# Patient Record
Sex: Male | Born: 1985 | Race: Black or African American | Hispanic: No | Marital: Single | Smoking: Current every day smoker
Health system: Southern US, Community
[De-identification: ages and names within clinical notes are randomized; demographics above are authoritative.]

## PROBLEM LIST (undated history)

## (undated) HISTORY — PX: SMALL INTESTINE SURGERY: SHX150

---
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
## 2006-12-20 ENCOUNTER — Emergency Department (HOSPITAL_COMMUNITY): Admission: EM | Admit: 2006-12-20 | Discharge: 2006-12-20 | Payer: Self-pay | Admitting: Family Medicine

## 2011-04-30 LAB — POCT URINALYSIS DIP (DEVICE)
Bilirubin Urine: NEGATIVE
Ketones, ur: NEGATIVE
Operator id: 235561
Protein, ur: NEGATIVE
Specific Gravity, Urine: 1.025

## 2014-05-04 ENCOUNTER — Emergency Department (HOSPITAL_COMMUNITY): Payer: Self-pay

## 2014-05-04 ENCOUNTER — Observation Stay (HOSPITAL_COMMUNITY)
Admission: EM | Admit: 2014-05-04 | Discharge: 2014-05-05 | Disposition: A | Discharge status: Home / Self Care | Payer: Self-pay | Attending: Internal Medicine | Admitting: Internal Medicine

## 2014-05-04 ENCOUNTER — Encounter (HOSPITAL_COMMUNITY): Payer: Self-pay | Admitting: Emergency Medicine

## 2014-05-04 DIAGNOSIS — R112 Nausea with vomiting, unspecified: Secondary | ICD-10-CM

## 2014-05-04 DIAGNOSIS — R1110 Vomiting, unspecified: Secondary | ICD-10-CM | POA: Insufficient documentation

## 2014-05-04 DIAGNOSIS — K2970 Gastritis, unspecified, without bleeding: Secondary | ICD-10-CM

## 2014-05-04 DIAGNOSIS — R1013 Epigastric pain: Principal | ICD-10-CM | POA: Insufficient documentation

## 2014-05-04 DIAGNOSIS — Z23 Encounter for immunization: Secondary | ICD-10-CM | POA: Insufficient documentation

## 2014-05-04 DIAGNOSIS — R109 Unspecified abdominal pain: Secondary | ICD-10-CM

## 2014-05-04 LAB — CBC WITH DIFFERENTIAL/PLATELET
BASOS ABS: 0 10*3/uL (ref 0.0–0.1)
Basophils Relative: 0 % (ref 0–1)
EOS ABS: 0.1 10*3/uL (ref 0.0–0.7)
Eosinophils Relative: 1 % (ref 0–5)
HCT: 41.6 % (ref 39.0–52.0)
Hemoglobin: 14.5 g/dL (ref 13.0–17.0)
LYMPHS PCT: 10 % — AB (ref 12–46)
Lymphs Abs: 1.4 10*3/uL (ref 0.7–4.0)
MCH: 32.5 pg (ref 26.0–34.0)
MCHC: 34.9 g/dL (ref 30.0–36.0)
MCV: 93.3 fL (ref 78.0–100.0)
MONO ABS: 1.3 10*3/uL — AB (ref 0.1–1.0)
Monocytes Relative: 9 % (ref 3–12)
NEUTROS PCT: 80 % — AB (ref 43–77)
Neutro Abs: 11.6 10*3/uL — ABNORMAL HIGH (ref 1.7–7.7)
PLATELETS: 289 10*3/uL (ref 150–400)
RBC: 4.46 MIL/uL (ref 4.22–5.81)
RDW: 12.3 % (ref 11.5–15.5)
WBC: 14.4 10*3/uL — ABNORMAL HIGH (ref 4.0–10.5)

## 2014-05-04 LAB — COMPREHENSIVE METABOLIC PANEL
ALBUMIN: 4.2 g/dL (ref 3.5–5.2)
ALT: 19 U/L (ref 0–53)
AST: 17 U/L (ref 0–37)
Alkaline Phosphatase: 145 U/L — ABNORMAL HIGH (ref 39–117)
Anion gap: 11 (ref 5–15)
BUN: 8 mg/dL (ref 6–23)
CALCIUM: 9.5 mg/dL (ref 8.4–10.5)
CO2: 30 mEq/L (ref 19–32)
Chloride: 100 mEq/L (ref 96–112)
Creatinine, Ser: 0.82 mg/dL (ref 0.50–1.35)
GFR calc non Af Amer: >90 mL/min (ref >90)
GLUCOSE: 151 mg/dL — AB (ref 70–99)
POTASSIUM: 4.3 meq/L (ref 3.7–5.3)
SODIUM: 141 meq/L (ref 137–147)
TOTAL PROTEIN: 7.6 g/dL (ref 6.0–8.3)
Total Bilirubin: 0.5 mg/dL (ref 0.3–1.2)

## 2014-05-04 LAB — URINALYSIS, ROUTINE W REFLEX MICROSCOPIC
Bilirubin Urine: NEGATIVE
GLUCOSE, UA: NEGATIVE mg/dL
Hgb urine dipstick: NEGATIVE
Ketones, ur: NEGATIVE mg/dL
LEUKOCYTES UA: NEGATIVE
Nitrite: NEGATIVE
PROTEIN: NEGATIVE mg/dL
SPECIFIC GRAVITY, URINE: 1.01 (ref 1.005–1.030)
Urobilinogen, UA: 0.2 mg/dL (ref 0.0–1.0)
pH: 8 (ref 5.0–8.0)

## 2014-05-04 LAB — LIPASE, BLOOD: Lipase: 14 U/L (ref 11–59)

## 2014-05-04 LAB — I-STAT CG4 LACTIC ACID, ED: LACTIC ACID, VENOUS: 2.04 mmol/L (ref 0.5–2.2)

## 2014-05-04 MED ORDER — GI COCKTAIL ~~LOC~~
30.0000 mL | Freq: Once | ORAL | Status: AC
  Administered 2014-05-04: 30 mL via ORAL
  Filled 2014-05-04: qty 30

## 2014-05-04 MED ORDER — ONDANSETRON HCL 4 MG/2ML IJ SOLN
4.0000 mg | Freq: Four times a day (QID) | INTRAMUSCULAR | Status: DC | PRN
  Administered 2014-05-04: 4 mg via INTRAVENOUS
  Filled 2014-05-04: qty 2

## 2014-05-04 MED ORDER — ALUM & MAG HYDROXIDE-SIMETH 200-200-20 MG/5ML PO SUSP: 15.0000 mL | Freq: Once | ORAL | Status: DC

## 2014-05-04 MED ORDER — DIPHENHYDRAMINE HCL 25 MG PO CAPS
25.0000 mg | ORAL_CAPSULE | Freq: Every evening | ORAL | Status: DC | PRN
  Administered 2014-05-04: 25 mg via ORAL
  Filled 2014-05-04: qty 1

## 2014-05-04 MED ORDER — SODIUM CHLORIDE 0.9 % IJ SOLN: 3.0000 mL | Freq: Two times a day (BID) | INTRAMUSCULAR | Status: DC

## 2014-05-04 MED ORDER — PANTOPRAZOLE SODIUM 40 MG IV SOLR
40.0000 mg | Freq: Every day | INTRAVENOUS | Status: DC
  Administered 2014-05-04 – 2014-05-05 (×2): 40 mg via INTRAVENOUS
  Filled 2014-05-04 (×2): qty 40

## 2014-05-04 MED ORDER — RANITIDINE HCL 150 MG PO CAPS: 150.0000 mg | ORAL_CAPSULE | Freq: Every day | ORAL | Status: DC

## 2014-05-04 MED ORDER — SODIUM CHLORIDE 0.9 % IV SOLN
1000.0000 mL | Freq: Once | INTRAVENOUS | Status: AC
  Administered 2014-05-04 (×2): 1000 mL via INTRAVENOUS

## 2014-05-04 MED ORDER — LIDOCAINE VISCOUS 2 % MT SOLN: 15.0000 mL | Freq: Once | OROMUCOSAL | Status: DC

## 2014-05-04 MED ORDER — DOXYLAMINE SUCCINATE (SLEEP) 25 MG PO TABS
25.0000 mg | ORAL_TABLET | Freq: Every evening | ORAL | Status: DC | PRN
  Filled 2014-05-04: qty 1

## 2014-05-04 MED ORDER — HYDROMORPHONE HCL 1 MG/ML IJ SOLN
0.5000 mg | Freq: Four times a day (QID) | INTRAMUSCULAR | Status: DC | PRN
  Filled 2014-05-04: qty 1

## 2014-05-04 MED ORDER — ONDANSETRON HCL 4 MG/2ML IJ SOLN
4.0000 mg | Freq: Once | INTRAMUSCULAR | Status: AC
  Administered 2014-05-04: 4 mg via INTRAVENOUS
  Filled 2014-05-04: qty 2

## 2014-05-04 MED ORDER — HYDROMORPHONE HCL 1 MG/ML IJ SOLN
0.5000 mg | Freq: Four times a day (QID) | INTRAMUSCULAR | Status: DC | PRN
  Administered 2014-05-04 (×2): 0.5 mg via INTRAVENOUS
  Filled 2014-05-04: qty 1

## 2014-05-04 MED ORDER — FENTANYL CITRATE 0.05 MG/ML IJ SOLN
INTRAMUSCULAR | Status: AC
  Filled 2014-05-04: qty 2

## 2014-05-04 MED ORDER — HYDROMORPHONE HCL 1 MG/ML IJ SOLN
0.5000 mg | Freq: Once | INTRAMUSCULAR | Status: AC
  Administered 2014-05-04: 0.5 mg via INTRAVENOUS
  Filled 2014-05-04: qty 1

## 2014-05-04 MED ORDER — SODIUM CHLORIDE 0.9 % IV SOLN
1000.0000 mL | INTRAVENOUS | Status: DC
  Administered 2014-05-04 – 2014-05-05 (×3): 1000 mL via INTRAVENOUS

## 2014-05-04 MED ORDER — HEPARIN SODIUM (PORCINE) 5000 UNIT/ML IJ SOLN
5000.0000 [IU] | Freq: Three times a day (TID) | INTRAMUSCULAR | Status: DC
  Administered 2014-05-04 – 2014-05-05 (×2): 5000 [IU] via SUBCUTANEOUS
  Filled 2014-05-04 (×4): qty 1

## 2014-05-04 MED ORDER — IOHEXOL 300 MG/ML  SOLN: 100.0000 mL | Freq: Once | INTRAMUSCULAR | Status: DC | PRN

## 2014-05-04 MED ORDER — INFLUENZA VAC SPLIT QUAD 0.5 ML IM SUSY
0.5000 mL | PREFILLED_SYRINGE | INTRAMUSCULAR | Status: AC
  Administered 2014-05-05: 0.5 mL via INTRAMUSCULAR
  Filled 2014-05-04: qty 0.5

## 2014-05-04 MED ORDER — FENTANYL CITRATE 0.05 MG/ML IJ SOLN
50.0000 ug | Freq: Once | INTRAMUSCULAR | Status: AC
  Administered 2014-05-04: 50 ug via INTRAVENOUS

## 2014-05-04 MED ORDER — HYDROMORPHONE HCL 1 MG/ML IJ SOLN
0.5000 mg | Freq: Three times a day (TID) | INTRAMUSCULAR | Status: DC | PRN
  Administered 2014-05-04: 0.5 mg via INTRAVENOUS
  Filled 2014-05-04: qty 1

## 2014-05-04 MED ORDER — IOHEXOL 300 MG/ML  SOLN: 25.0000 mL | INTRAMUSCULAR | Status: DC | PRN

## 2014-05-04 NOTE — H&P (Addendum)
Pt seen and examined with Dr. Valentino Saxonothman. 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 Pt states that last night he ate chinese food and had a shot of alcohol and smoked some marijuana. Early this AM he developed abd pain - epigastric, radiated to RLQ initially but now mainly in epigastric region, crampy in nature, 10/10 at its worst but currently 4/10. He had associated n/v- 5 episodes initially containing food but now just clear fluid. No hematemesis, non bilious Pt denies flatus or having a BM since this AM. He states burping actually improves pain slightly. No fevers/chills, no CP, no sob, no palpitations, no lightheadedness/syncope. Pt did complain of diaphoresis assoc with n/v. Pt also complained of cloudy urine but no dysuria. Remaining ROS negative  Exam: Gen: AAO*3, NAD CV: RRR, normal heart sounds Pulm: CTA b/l Abd: soft, mild LLQ,LUQ tenderness, no rigidity/guarding Ext: no edema  Assessment and Plan: 28 y/o male p/w n/v, abd pain likely acute gastritis  Abd pain: - likely gastritis/gastroenteritis - abd x ray with dilated bowel loops but CT with no evidence of obstruction - CT with findings of gastritis - Pt with mild leukocytosis likely secondary to gastritis and stress demargination. Repeat CBC in AM - c/ IV PPI, ondansetron prn n/v - No abx for now. LA wnl - clear liquid diet for now - pain control prn, c/w IVF - check u/a - Surgery called by ED given concern for possible early obstruction but unlikely. Will await input  Case d/w patient, residents in detail

## 2014-05-04 NOTE — H&P (Signed)
Date: 05/04/2014               Patient Name:  Chad CootsGeorge W Wood MRN: [id number]  DOB: 02-14-1986 Age / Sex: 28 y.o., male   PCP: No primary provider on file.         Medical Service: Internal Medicine Teaching Service         Attending Physician: Dr. Inez CatalinaEmily B Mullen, MD    First Contact: Dr. Farley LyAdam Quintasha Gren Pager: [phone]  Second Contact: Dr. Evelena PeatAlex Wilson Pager: [phone]       After Hours (After 5p/  First Contact Pager: [phone]  weekends / holidays): Second Contact Pager: [phone]   Chief Complaint: abdominal pain  History of Present Illness: Mr Chad SheehanJenkins is a 28 year old man s/p small bowel resection 2/2 short bowel syndrome as an infant who presents with abdominal pain and vomiting. He was in his usual state of health until last night. He had fresh shrimp [place] food, a blunt of marijuana, and a shot of tequila in the evening. He then developed epigastric abdominal pain a few hours later. He says the pain is a cramping pain that waxes and wanes. He rates it as greater than 10/10 when he came to the ED but it is now about a 4-6. He briefly had some shortness of breath with the pain in his abdomen last night. He has never had this pain before. He has not had a bowel movement since the onset of pain. He said it radiated to his right lower quadrant after he had a cloudy urination but is now back to his epigastric region. He also has had 5 episodes of non-bloody non-bilious emesis. He has tolerated drinking ginger ale without aggravation of the pain. He has no appetite and also has been diaphoretic. He says he has not passed gas but feels like he has to and keeps burping. He denies any recent fevers, chills, diarrhea, constipation, melena, hematochezia, dysuria, polyuria, pain elsewhere, recent travel.  In the ED, he received NS IVF, zofran 4 mg IV x 2, dilaudid 0.5 mg IV, gi cocktail x 1, and fentanyl 50 mcg IV once.  Meds: Current Facility-Administered Medications  Medication Dose Route  Frequency Provider Last Rate Last Dose  . 0.9 %  sodium chloride infusion  1,000 mL Intravenous Continuous Fredirick LatheAndrew Peter Wong, MD 125 mL/hr at 05/04/14 0936 1,000 mL at 05/04/14 0936  . HYDROmorphone (DILAUDID) injection 0.5 mg  0.5 mg Intravenous Q8H PRN Lorenda HatchetAdam L Cordel Drewes, MD      . iohexol (OMNIPAQUE) 300 MG/ML solution 100 mL  100 mL Intravenous Once PRN Medication Radiologist, MD      . ondansetron (ZOFRAN) injection 4 mg  4 mg Intravenous Q6H PRN Lorenda HatchetAdam L Kentrell Guettler, MD      . pantoprazole (PROTONIX) injection 40 mg  40 mg Intravenous Daily Yolanda MangesAlex M Wilson, DO   40 mg at 05/04/14 1314   Current Outpatient Prescriptions  Medication Sig Dispense Refill  . Multiple Vitamins-Minerals (MULTIVITAMIN WITH MINERALS) tablet Take 1 tablet by mouth daily.      . ranitidine (ZANTAC) 150 MG capsule Take 1 capsule (150 mg total) by mouth daily.  30 capsule  0    Allergies: Allergies as of 05/04/2014  . (No Known Allergies)   History reviewed. No pertinent past medical history. Past Surgical History  Procedure Laterality Date  . Small intestine surgery     No family history on file. History   Social History  . Marital Status: Single  Spouse Name: N/A    Number of Children: N/A  . Years of Education: N/A   Occupational History  . Not on file.   Social History Main Topics  . Smoking status: Never Smoker   . Smokeless tobacco: Not on file  . Alcohol Use: Yes     Comment: occas  . Drug Use: Yes    Special: Marijuana  . Sexual Activity: Not on file   Other Topics Concern  . Not on file   Social History Narrative  . No narrative on file    Review of Systems: A comprehensive review of systems was negative except for: as noted above in HPI  Physical Exam: Blood pressure 123/69, pulse 87, temperature 97.6 F (36.4 C), temperature source Oral, resp. rate 20, SpO2 100.00%.  Gen: A&O x 4, diaphoretic, well developed, well nourished HEENT: Atraumatic, PERRL, EOMI, sclerae anicteric, moist  mucous membranes Neck: No LAD Heart: Regular rate and rhythm, normal S1 S2, no murmurs, rubs, or gallops Lungs: Clear to auscultation bilaterally, respirations unlabored Abd: Well healed vertical midline scar, soft, mild tenderness to palpation in LUQ and LLQ, non-distended, + bowel sounds, no hepatosplenomegaly, no Murphy's sign, no CVA tenderness Ext: No edema or cyanosis, 2+ PT pulses b/l   Lab results: Basic Metabolic Panel:  Recent Labs  40/98/1110/23/15 0849  NA 141  K 4.3  CL 100  CO2 30  GLUCOSE 151*  BUN 8  CREATININE 0.82  CALCIUM 9.5   Liver Function Tests:  Recent Labs  05/04/14 0849  AST 17  ALT 19  ALKPHOS 145*  BILITOT 0.5  PROT 7.6  ALBUMIN 4.2    Recent Labs  05/04/14 0849  LIPASE 14   No results found for this basename: AMMONIA,  in the last 72 hours CBC:  Recent Labs  05/04/14 0849  WBC 14.4*  NEUTROABS 11.6*  HGB 14.5  HCT 41.6  MCV 93.3  PLT 289   Misc. Labs: i-stat lactic acid 2.04  Imaging results:  Ct Abdomen Pelvis W Contrast  05/04/2014   CLINICAL DATA:  Abdomen pain. Patient had 90% of his small bowel removed when he was born. Assess for pancreatitis.  EXAM: CT ABDOMEN AND PELVIS WITH CONTRAST  TECHNIQUE: Multidetector CT imaging of the abdomen and pelvis was performed using the standard protocol following bolus administration of intravenous contrast.  CONTRAST:  100 mL Omnipaque 300  COMPARISON:  None.  FINDINGS: The liver, spleen, pancreas, adrenal glands and kidneys are normal. The gallbladder is decompressed limiting evaluation. There is no hydronephrosis bilaterally. There is a gastric wall thickening in the gastric antrum and body. There is no free air. Bowel content is noted throughout colon. The visualized small amount of small bowel loops are prominent in caliber. There is a 4.4 mm calcification medial to the mid ascending colon, nonspecific. The aorta is normal. There is no abdominal lymphadenopathy.  Fluid-filled bladder is  normal. There is no pelvic lymphadenopathy. The visualized lung bases are clear. No acute abnormalities identified within the visualized bones.  IMPRESSION: No CT evidence of pancreatitis. Bowel wall thickening of the gastric antrum and body which can be seen in gastritis.   Electronically Signed   By: Sherian ReinWei-Chen  Lin M.D.   On: 05/04/2014 10:54   Dg Abd Acute W/chest  05/04/2014   CLINICAL DATA:  Pain.  EXAM: ACUTE ABDOMEN SERIES (ABDOMEN 2 VIEW & CHEST 1 VIEW)  COMPARISON:  CT 05/04/2014.  FINDINGS: No acute cardiopulmonary disease.  Soft tissue structures are unremarkable. Dilated  loops of what appear to be small bowel noted. Developing small bowel obstruction cannot be excluded. These changes may be related this adynamic ileus. Colon is unremarkable. No free air. Calcification noted over the right flank as noted on prior CT is unchanged in position and nonspecific. No acute bony abnormality.  IMPRESSION: 1. Prominently dilated loops of bowel noted in the upper abdomen, these most likely small bowel loops. Developing small bowel obstruction cannot be excluded. 2. No acute abnormalities otherwise noted.  Pressure   Electronically Signed   By: Maisie Fus  Register   On: 05/04/2014 11:29    Other results: EKG: none.  Assessment & Plan by Problem: Active Problems:   Abdominal pain   Gastritis  #Gastritis v Gastroenteritis: Mr Slusher has 1 d of epigastric abdominal pain with NBNB emesis. 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 He does say that this was in the setting of shrimp [place] food, THC, and tequila. He is afebrile but WBC 14.4. Lipase negative makes pancreatitis unlikely. Alkaline phosphatase 145 with CMP otherwise normal. Cholecystitis or other bile duct abnormality unlikely as he had CT abdomen in the ED only notable for bowel wall thickening of the gastric antrum and body which can be seen in  gastritis. Substance use would aggravate gastritis and can explain. He may also have gastroenteritis as he has elevated WBC and is diaphoretic. His abdominal film did have prominently dilated loops of bowel in upper abdomen and he has prior surgical history which can lead to adhesions, but CT results make SBO unlikely although he has not passed gas today. Reports cloudy urine but no dysuria or polyuria and no suprapubic or CVA tenderness so UTI unlikely. He was seen by surgery in the ED who do not think he has an SBO and suggest medical management for likely gastritis. -appreciate surgery -check UA w reflex microscopy -repeat CMP, CBC in am -pantoprazole 40 mg IV daily -ondansetron 4 mg iv q6hprn -dilaudid 0.5 mg iv q8hprn -NS IVF 125 mL/hr -advance diet as tolerated  #Diet : clears  #VTE PPx: heparin 5000 u Ryan TID, SCDs  Dispo: Disposition is deferred at this time, awaiting improvement of current medical problems. Anticipated discharge in approximately 1 day(s).   The patient does not know have a current PCP (No primary provider on file.) and does need an [facility] hospital follow-up appointment after discharge.  The patient does not have transportation limitations that hinder transportation to clinic appointments.  Signed: Lorenda Hatchet, MD 05/04/2014, 1:44 PM

## 2014-05-04 NOTE — ED Notes (Signed)
Pt given urinal for sample. Stated that he would try and give a sample. Instructed to call when sample is obtained

## 2014-05-04 NOTE — Discharge Planning (Signed)
[facility] Community Health & Eligibility Specialist  Spoke with patient regarding primary care resources and the [facility]. Orange card application and instructions provided. Resource guide and my contact information also given for any future questions or concerns. No other Community Health & Eligibility Specialist needs identified at this time.

## 2014-05-04 NOTE — ED Notes (Signed)
Pt being transported to CT

## 2014-05-04 NOTE — ED Provider Notes (Signed)
I saw and evaluated the patient, reviewed the resident's note and I agree with the findings and plan.   .Face to face Exam:  General:  Awake HEENT:  Atraumatic Resp:  Normal effort Abd:  Nondistended Neuro:No focal weakness Lymph: No adenopathy  Nelia Shiobert L Nikeshia Keetch, MD 05/04/14 1123

## 2014-05-04 NOTE — ED Notes (Addendum)
Dr. Andrey CampanileWilson paged about pt's pain medicine. Per admitting doctor, she will look into changing pain medication order due to pt's request for more pain medicine.

## 2014-05-04 NOTE — ED Notes (Signed)
Spoke with Dr. Andrey CampanileWilson on phone regarding pt's episode of emesis.

## 2014-05-04 NOTE — ED Notes (Signed)
General surgery at bedside for consult

## 2014-05-04 NOTE — ED Notes (Signed)
Pt reports some relief from abdominal pain, pt getting ready to be discharged and ginger ale given.  Pt tolerating PO intake well, is taking sips, reports "it makes me burp" which relieves discomfort.

## 2014-05-04 NOTE — H&P (Signed)
  I have seen and examined the patient myself, and I have reviewed the note by Duwayne Heckanielle Day, MS 3 and was present during the interview and physical exam.  Please see my separate H&P for additional findings, assessment, and plan.   Signed: Lorenda HatchetAdam L Kayleeann Huxford, MD 05/04/2014, 5:28 PM

## 2014-05-04 NOTE — ED Provider Notes (Addendum)
CSN: [id number]     Arrival date & time 05/04/14  0825 History   First MD Initiated Contact with Patient 05/04/14 0913     Chief Complaint  Patient presents with  . Abdominal Pain     (Consider location/radiation/quality/duration/timing/severity/associated sxs/prior Treatment) Patient is a 28 y.o. male presenting with abdominal pain. The history is provided by the patient. No language interpreter was used.  Abdominal Pain Pain location:  Epigastric Pain quality: sharp   Pain radiates to:  Does not radiate Pain severity:  Severe Onset quality:  Sudden Duration:  7 hours Timing:  Constant Progression:  Worsening Chronicity:  New Context: suspicious food intake (possible)   Context: not recent illness, not recent travel and not sick contacts   Relieved by:  Lying down Worsened by:  Movement Ineffective treatments:  None tried Associated symptoms: flatus   Associated symptoms: no chest pain, no cough, no diarrhea, no dysuria, no fever, no hematemesis, no hematuria, no melena, no nausea, no shortness of breath, no sore throat and no vomiting   Risk factors: no recent hospitalization     History reviewed. No pertinent past medical history. Past Surgical History  Procedure Laterality Date  . Small intestine surgery     No family history on file. History  Substance Use Topics  . Smoking status: Never Smoker   . Smokeless tobacco: Not on file  . Alcohol Use: Yes     Comment: occas    Review of Systems  Constitutional: Negative for fever.  HENT: Negative for congestion, rhinorrhea and sore throat.   Respiratory: Negative for cough and shortness of breath.   Cardiovascular: Negative for chest pain.  Gastrointestinal: Positive for flatus. Negative for nausea, vomiting, abdominal pain, diarrhea, melena and hematemesis.  Genitourinary: Negative for dysuria and hematuria.  Skin: Negative for rash.  Neurological: Negative for syncope, light-headedness and headaches.  All other  systems reviewed and are negative.     Allergies  Review of patient's allergies indicates no known allergies.  Home Medications   Prior to Admission medications   Medication Sig Start Date End Date Taking? Authorizing Provider  Multiple Vitamins-Minerals (MULTIVITAMIN WITH MINERALS) tablet Take 1 tablet by mouth daily.   Yes Historical Provider, MD   BP 142/84  Pulse 77  Temp(Src) 97.6 F (36.4 C) (Oral)  Resp 18  SpO2 100% Physical Exam  Nursing note and vitals reviewed. Constitutional: He is oriented to person, place, and time. He appears well-developed and well-nourished.  HENT:  Head: Normocephalic and atraumatic.  Right Ear: External ear normal.  Left Ear: External ear normal.  Eyes: EOM are normal.  Neck: Normal range of motion. Neck supple.  Cardiovascular: Normal rate, regular rhythm and intact distal pulses.  Exam reveals no gallop and no friction rub.   No murmur heard. Pulmonary/Chest: Effort normal and breath sounds normal. No respiratory distress. He has no wheezes. He has no rales. He exhibits no tenderness.  Abdominal: Soft. Bowel sounds are normal. He exhibits no distension. There is tenderness (epigastric). There is no rebound.  No rigidity  Musculoskeletal: Normal range of motion. He exhibits no edema and no tenderness.  Lymphadenopathy:    He has no cervical adenopathy.  Neurological: He is alert and oriented to person, place, and time.  Skin: Skin is warm. No rash noted.  Psychiatric: He has a normal mood and affect. His behavior is normal.    ED Course  Procedures (including critical care time) Labs Review Labs Reviewed  CBC WITH DIFFERENTIAL - Abnormal;  Notable for the following:    WBC 14.4 (*)    Neutrophils Relative % 80 (*)    Lymphocytes Relative 10 (*)    Neutro Abs 11.6 (*)    Monocytes Absolute 1.3 (*)    All other components within normal limits  COMPREHENSIVE METABOLIC PANEL - Abnormal; Notable for the following:    Glucose, Bld  151 (*)    Alkaline Phosphatase 145 (*)    All other components within normal limits  LIPASE, BLOOD  URINALYSIS, ROUTINE W REFLEX MICROSCOPIC  I-STAT CG4 LACTIC ACID, ED    Imaging Review Ct Abdomen Pelvis W Contrast  05/04/2014   CLINICAL DATA:  Abdomen pain. Patient had 90% of his small bowel removed when he was born. Assess for pancreatitis.  EXAM: CT ABDOMEN AND PELVIS WITH CONTRAST  TECHNIQUE: Multidetector CT imaging of the abdomen and pelvis was performed using the standard protocol following bolus administration of intravenous contrast.  CONTRAST:  100 mL Omnipaque 300  COMPARISON:  None.  FINDINGS: The liver, spleen, pancreas, adrenal glands and kidneys are normal. The gallbladder is decompressed limiting evaluation. There is no hydronephrosis bilaterally. There is a gastric wall thickening in the gastric antrum and body. There is no free air. Bowel content is noted throughout colon. The visualized small amount of small bowel loops are prominent in caliber. There is a 4.4 mm calcification medial to the mid ascending colon, nonspecific. The aorta is normal. There is no abdominal lymphadenopathy.  Fluid-filled bladder is normal. There is no pelvic lymphadenopathy. The visualized lung bases are clear. No acute abnormalities identified within the visualized bones.  IMPRESSION: No CT evidence of pancreatitis. Bowel wall thickening of the gastric antrum and body which can be seen in gastritis.   Electronically Signed   By: Abelardo Diesel M.D.   On: 05/04/2014 10:54   CLINICAL DATA: Pain.  EXAM: ACUTE ABDOMEN SERIES (ABDOMEN 2 VIEW & CHEST 1 VIEW)  COMPARISON: CT 05/04/2014.  FINDINGS: No acute cardiopulmonary disease.  Soft tissue structures are unremarkable. Dilated loops of what appear to be small bowel noted. Developing small bowel obstruction cannot be excluded. These changes may be related this adynamic ileus. Colon is unremarkable. No free air. Calcification noted over the right  flank as noted on prior CT is unchanged in position and nonspecific. No acute bony abnormality.  IMPRESSION: 1. Prominently dilated loops of bowel noted in the upper abdomen, these most likely small bowel loops. Developing small bowel obstruction cannot be excluded. 2. No acute abnormalities otherwise noted. Pressure    EKG Interpretation None      MDM   Final diagnoses:  None    9:13 AM Pt is a 28 y.o. male with no pertinent PMHX who presents to the ED with sudden onset epigastric pain this morning 1-2AM. Sharp intermittent pain worse with movement, better with laying still. No fevers. Vomiting 5x today non bilious non bloody emesis. No diarrhea. Previous surgery on small bowel as child. Last BM yesterday morning. Passing gas. No other significant abdominal surgeries. Denies chest pain. Possible suspect food intake. Passing gas. Possible obstruction given previous surgery. No family or personal history of inflammatory bowel disease. No bloody stools  On exam: diaphoretic. Epigastric tenderness. No rigidity. No RLQ tenderness. No murphy's no peritoneal signs. Concern for possible pancreatitis versus small bowel obstruction versus possible perforated ulcer: no ETOH abuse: no bloody emesis.  Plan for CBC, CMP, lactic acid, fluids, pain medications, acute abdominal series to rule out free air and infused abdomen  pelvis CT  Review of labs: Lactic acid: 2.04 CBc: leukocytosis, H&H 14.5/41.6 Lipase: 14 CMP: no electrolyte abnormalities, elevated alk phos  11:10AM patient feeling subjectively better. CT abodmen pelvis w contrast: no evidence of pancreatitis, evidence of gastritis with no evidence of free air or perforated ulcer. Concern for early bowel obstruction on plain film imaging. Consult to surgery  Per surgery no acute surgical intervention. Plan to consult internal medicine teaching service for admission for gastritis bowel rest and IVF  Labs, EKG and imaging reviewed by myself  and considered in medical decision making if ordered.  Imaging interpreted by radiology. Pt was discussed with my attending, Dr. Lulu Riding, MD 05/04/14 [place], MD 05/04/14 [phone]

## 2014-05-04 NOTE — ED Notes (Signed)
Lactic acid results given to Dr. Beaton 

## 2014-05-04 NOTE — ED Notes (Signed)
Pt reports upper abd pain since last night and emesis x 4 between last night and now. Pt states he had just finished eating when the pain started. Was unable to sleep last night.

## 2014-05-04 NOTE — H&P (Signed)
Date: 05/04/2014               Patient Name:  Chad Wood MRN: [id number]  DOB: [birth year] Age / Sex: 28 y.o., male   PCP: No primary provider on file.              Medical Service: Internal Medicine Teaching Service              Attending Physician: Dr. Inez Catalina, MD    First Contact:  MS  Pager: [phone]  Second Contact: Dr.  Dineen Kid: [phone]  Third Contact Dr.  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 He was in his usual state of health until last night. He describes his abdominal pain as starting out as a 10/10 last evening and resolving to a 4/10 by this afternoon. He describes a cramping pain as coming and going and occasionally radiating out to his right flank, followed by a generalized tightness in his left upper quadrant. He has not been able to pass gas or stool today, but yesterday he had a normal bowel movement and has been excessively burping. He has vomited 5 times since last night when the abdominal pain began and describes the vomiting as non-bloody and non-bilious. He endorses eating chinese food with shrimp last night followed by a tequila shot, and marijuana. He has not eaten anything yet today and has not had an appetite, but he was able to drink some ginger ale. He denies headaches, dizziness, SOB, but describes sweating and chills. He denied dysuria/frequency/urethral discharge but described his urine as cloudy yesterday. He has not recently traveled in or out of the [place] recently.   Chad Wood got an abdominal CT and DG in the emergency department. The abdominal CT showed evidence of bowel wall thickening of the gastric antrum and body  which can be seen in gastritis and no evidence of pancreatitis and the abdominal DG noted dilated loops of small bowel and was not able to rule out a developing SBO. His labs while in the ED were notable for an elevated white blood cell count of 14.4, an elevated blood glucose of 151, and an elevated alkaline phosphate of 145. In the ED he was also treated with NS IVF, zofran 4 mg IV x 2, dilaudid 0.5 mg IV, gi cocktail x 1, and fentanyl 50 mcg IV once. Surgery also saw Chad Wood earlier today and they attribute his abdominal illness to gastritis or gastroenteritis rather than a SBO, so surgical intervention is not currently required.   Meds: Current Facility-Administered Medications  Medication Dose Route Frequency Provider Last Rate Last Dose  . 0.9 %  sodium chloride infusion  1,000 mL Intravenous Continuous Fredirick Lathe, MD 125 mL/hr at 05/04/14 0936 1,000 mL at 05/04/14 0936  . iohexol (OMNIPAQUE) 300 MG/ML solution 100 mL  100 mL Intravenous Once PRN Medication Radiologist, MD      . pantoprazole (PROTONIX) injection 40 mg  40 mg Intravenous Daily Yolanda Manges, DO   40 mg at 05/04/14 1314   Current Outpatient Prescriptions  Medication Sig Dispense Refill  . Multiple  Vitamins-Minerals (MULTIVITAMIN WITH MINERALS) tablet Take 1 tablet by mouth daily.      . ranitidine (ZANTAC) 150 MG capsule Take 1 capsule (150 mg total) by mouth daily.  30 capsule  0    Allergies: Allergies as of 05/04/2014  . (No Known Allergies)   History reviewed. No pertinent past medical history. Past Surgical History  Procedure Laterality Date  . Small intestine surgery     No family history on file. History   Social History  . Marital Status: Single    Spouse Name: N/A    Number of Children: N/A  . Years of Education: N/A   Occupational History  . Not on file.   Social History Main Topics  . Smoking status: Never Smoker   . Smokeless tobacco: Not on file  . Alcohol Use: Yes     Comment:  occas  . Drug Use: Yes    Special: Marijuana  . Sexual Activity: Not on file   Other Topics Concern  . Not on file   Social History Narrative  . No narrative on file    Review of Systems: All other systems besides those mention in the HPI were negative.   Physical Exam: Blood pressure 124/82, pulse 77, temperature 97.6 F (36.4 C), temperature source Oral, resp. rate 18, SpO2 100.00%. Gen: AAO, diaphoretic, well-nourished, ill-appearing HEENT: Atruamatic, MMM, no LAD Card: RRR, normal S1/S2, no murmurs/rubs/gallops Pulm: CTA BL, no increased work of breathing Abd: Well healed vertical midline scar. Mild tenderness to palpation in LUQ. + BS, non-distended, no rebound/guarding, no hepatosplenomegaly, no Murphy's sign, no CVA tenderness Ext: No edema or cyanosis, 2+ PP BL  Lab results: Basic Metabolic Panel:   Recent Labs   05/04/14 0849   NA  141   K  4.3   CL  100   CO2  30   GLUCOSE  151*   BUN  8   CREATININE  0.82   CALCIUM  9.5    Liver Function Tests:   Recent Labs   05/04/14 0849   AST  17   ALT  19   ALKPHOS  145*   BILITOT  0.5   PROT  7.6   ALBUMIN  4.2     Recent Labs   05/04/14 0849   LIPASE  14    No results found for this basename: AMMONIA, in the last 72 hours  CBC:   Recent Labs   05/04/14 0849   WBC  14.4*   NEUTROABS  11.6*   HGB  14.5   HCT  41.6   MCV  93.3   PLT  289    Misc. Labs:  i-stat lactic acid 2.04   Imaging results:  Ct Abdomen Pelvis W Contrast  05/04/2014   CLINICAL DATA:  Abdomen pain. Patient had 90% of his small bowel removed when he was born. Assess for pancreatitis.  EXAM: CT ABDOMEN AND PELVIS WITH CONTRAST  TECHNIQUE: Multidetector CT imaging of the abdomen and pelvis was performed using the standard protocol following bolus administration of intravenous contrast.  CONTRAST:  100 mL Omnipaque 300  COMPARISON:  None.  FINDINGS: The liver, spleen, pancreas, adrenal glands and kidneys are normal. The  gallbladder is decompressed limiting evaluation. There is no hydronephrosis bilaterally. There is a gastric wall thickening in the gastric antrum and body. There is no free air. Bowel content is noted throughout colon. The visualized small amount of small bowel loops are prominent in caliber. There is a 4.4 mm  calcification medial to the mid ascending colon, nonspecific. The aorta is normal. There is no abdominal lymphadenopathy.  Fluid-filled bladder is normal. There is no pelvic lymphadenopathy. The visualized lung bases are clear. No acute abnormalities identified within the visualized bones.  IMPRESSION: No CT evidence of pancreatitis. Bowel wall thickening of the gastric antrum and body which can be seen in gastritis.   Electronically Signed   By: Sherian ReinWei-Chen  Lin M.D.   On: 05/04/2014 10:54   Dg Abd Acute W/chest  05/04/2014   CLINICAL DATA:  Pain.  EXAM: ACUTE ABDOMEN SERIES (ABDOMEN 2 VIEW & CHEST 1 VIEW)  COMPARISON:  CT 05/04/2014.  FINDINGS: No acute cardiopulmonary disease.  Soft tissue structures are unremarkable. Dilated loops of what appear to be small bowel noted. Developing small bowel obstruction cannot be excluded. These changes may be related this adynamic ileus. Colon is unremarkable. No free air. Calcification noted over the right flank as noted on prior CT is unchanged in position and nonspecific. No acute bony abnormality.  IMPRESSION: 1. Prominently dilated loops of bowel noted in the upper abdomen, these most likely small bowel loops. Developing small bowel obstruction cannot be excluded. 2. No acute abnormalities otherwise noted.  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   1. Vomiting: Gastritis vs  gastroenteritis is the most likely cause. 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  2. Abdominal Pain:  Chad Wood has an elevated white blood cell count but he is afebrile and pancreatis is unlikely after negative CT scan and normal Lipase. The CT scan was not concerning for cholecystitis or bowel duct abnormalities and Chad Wood has not had any pale stools, jaundice, or RUQ pain. His history of small bowel resection make adhesions a possibility and he has not been able to pass gas or stool today, but the CT scan is not concerning for SBO and surgery confirmed that it is not likely. The CT scan did show gastric antrum and body thickening which could be exacerbated by alcohol and THC ingestion. It is also possible that he came down with food poisoning after consuming shrimp chinese food. His elevated WBC count of 14.4 and diaphoresis would support gastroenteritis..  -repeat CMP, CBC in am  -pantoprazole 40 mg IV daily  -ondansetron 4 mg iv q6hprn  -dilaudid 0.5 mg iv q8hprn  -NS IVF 125 mL/hr  -advance diet as tolerated  3. Cloudy urine: Reports cloudy urine but no dysuria or polyuria and no suprapubic or CVA tenderness so UTI unlikely -Urinalysis with reflex culture pending -Utox  4. Diet: Clears  5. Ppx: heparin 5000 u  TID, SCDs  6. Dispo: Anticipated discharge in 1 Bhavesh Vazquez if sx resolve. Awaiting improvement of current medical problems.   This is a Psychologist, occupationalMedical Student Note.  The care of the patient was discussed with Dr. Koren BoundNerendra and the assessment and plan was formulated with their assistance.  Please see their note for official documentation of the patient encounter.   Signed: Hennie Duosanielle E Clarke Peretz, Med Student 05/04/2014, 1:17 PM

## 2014-05-04 NOTE — ED Notes (Signed)
Pt returned from CT and xray. 

## 2014-05-04 NOTE — ED Provider Notes (Signed)
I saw and evaluated the patient, reviewed the resident's note and I agree with the findings and plan.   .Face to face Exam:  General:  Awake HEENT:  Atraumatic Resp:  Normal effort Abd:  Nondistended  Some epigastric tenderness Neuro:No focal weakness   Nelia Shiobert L Evaleen Sant, MD 05/04/14 2024

## 2014-05-04 NOTE — ED Notes (Signed)
Pt currently throwing up yellow emesis.  Pt on all 4 in bed.  HR stable.

## 2014-05-05 LAB — COMPREHENSIVE METABOLIC PANEL
ALT: 15 U/L (ref 0–53)
ANION GAP: 10 (ref 5–15)
AST: 15 U/L (ref 0–37)
Albumin: 3.5 g/dL (ref 3.5–5.2)
Alkaline Phosphatase: 132 U/L — ABNORMAL HIGH (ref 39–117)
BUN: 11 mg/dL (ref 6–23)
CALCIUM: 8.8 mg/dL (ref 8.4–10.5)
CO2: 27 meq/L (ref 19–32)
CREATININE: 0.84 mg/dL (ref 0.50–1.35)
Chloride: 104 mEq/L (ref 96–112)
GFR calc non Af Amer: >90 mL/min (ref >90)
GLUCOSE: 129 mg/dL — AB (ref 70–99)
Potassium: 4.4 mEq/L (ref 3.7–5.3)
Sodium: 141 mEq/L (ref 137–147)
Total Bilirubin: 0.6 mg/dL (ref 0.3–1.2)
Total Protein: 6.8 g/dL (ref 6.0–8.3)

## 2014-05-05 LAB — CBC
HCT: 39 % (ref 39.0–52.0)
Hemoglobin: 13.7 g/dL (ref 13.0–17.0)
MCH: 32.2 pg (ref 26.0–34.0)
MCHC: 35.1 g/dL (ref 30.0–36.0)
MCV: 91.5 fL (ref 78.0–100.0)
Platelets: 245 10*3/uL (ref 150–400)
RBC: 4.26 MIL/uL (ref 4.22–5.81)
RDW: 12.3 % (ref 11.5–15.5)
WBC: 12.6 10*3/uL — ABNORMAL HIGH (ref 4.0–10.5)

## 2014-05-05 MED ORDER — RANITIDINE HCL 150 MG PO CAPS: 150.0000 mg | ORAL_CAPSULE | Freq: Two times a day (BID) | ORAL | Status: DC

## 2014-05-05 MED ORDER — PROMETHAZINE HCL 12.5 MG PO TABS: 12.5000 mg | ORAL_TABLET | Freq: Four times a day (QID) | ORAL | Status: DC | PRN

## 2014-05-05 NOTE — Progress Notes (Signed)
Subjective: No acute events overnight. Patient says his pain is at a 3-4/10 and that he is feeling much better. He states that he is passing gas and not stool.   Objective: Vital signs in last 24 hours: Filed Vitals:   05/04/14 1644 05/04/14 1703 05/04/14 2128 05/05/14 0536  BP: 106/57  126/54 124/60  Pulse: 72  61 70  Temp: 98.6 F (37 C)  98.3 F (36.8 C) 98.3 F (36.8 C)  TempSrc: Oral  Oral Oral  Resp: 18  19 17   Height:  5\' 4"  (1.626 m)    Weight:  61.417 kg (135 lb 6.4 oz)    SpO2: 100%  97% 96%   Weight change:   Intake/Output Summary (Last 24 hours) at 05/05/14 0934 Last data filed at 05/05/14 0600  Gross per 24 hour  Intake   1822 ml  Output    800 ml  Net   1022 ml   Gen: A&O x 4, diaphoretic but less than yesterday, well developed, well nourished  HEENT: Atraumatic, PERRL, EOMI, sclerae anicteric, moist mucous membranes  Heart: Regular rate and rhythm, normal S1 S2, no murmurs, rubs, or gallops  Lungs: Clear to auscultation bilaterally, respirations unlabored  Abd: Well healed vertical midline scar, soft, minimal tenderness to palpation in LUQ , non-distended, + bowel sounds, no hepatosplenomegaly, no Murphy's sign, no CVA tenderness  Ext: No edema or cyanosis, 2+ PT pulses b/l   Lab Results:  Basic Metabolic Panel:   Recent Labs  Lab  05/04/14 0849  05/05/14 0349   NA  141  141   K  4.3  4.4   CL  100  104   CO2  30  27   GLUCOSE  151*  129*   BUN  8  11   CREATININE  0.82  0.84   CALCIUM  9.5  8.8    Liver Function Tests:   Recent Labs  Lab  05/04/14 0849  05/05/14 0349   AST  17  15   ALT  19  15   ALKPHOS  145*  132*   BILITOT  0.5  0.6   PROT  7.6  6.8   ALBUMIN  4.2  3.5     Recent Labs  Lab  05/04/14 0849   LIPASE  14    No results found for this basename: AMMONIA, in the last 168 hours  CBC:   Recent Labs  Lab  05/04/14 0849  05/05/14 0349   WBC  14.4*  12.6*   NEUTROABS  11.6*  --   HGB  14.5  13.7   HCT  41.6  39.0    MCV  93.3  91.5   PLT  289  245    Urinalysis:   Recent Labs  Lab  05/04/14 1505   COLORURINE  YELLOW   LABSPEC  1.010   PHURINE  8.0   GLUCOSEU  NEGATIVE   HGBUR  NEGATIVE   BILIRUBINUR  NEGATIVE   KETONESUR  NEGATIVE   PROTEINUR  NEGATIVE   UROBILINOGEN  0.2   NITRITE  NEGATIVE   LEUKOCYTESUR  NEGATIVE    Misc. Labs:  i-stat lactic acid 2.04   Micro Results: No results found for this or any previous visit (from the past 240 hour(s)). Studies/Results: Ct Abdomen Pelvis W Contrast  05/04/2014   CLINICAL DATA:  Abdomen pain. Patient had 90% of his small bowel removed when he was born. Assess for pancreatitis.  EXAM: CT ABDOMEN AND PELVIS  WITH CONTRAST  TECHNIQUE: Multidetector CT imaging of the abdomen and pelvis was performed using the standard protocol following bolus administration of intravenous contrast.  CONTRAST:  100 mL Omnipaque 300  COMPARISON:  None.  FINDINGS: The liver, spleen, pancreas, adrenal glands and kidneys are normal. The gallbladder is decompressed limiting evaluation. There is no hydronephrosis bilaterally. There is a gastric wall thickening in the gastric antrum and body. There is no free air. Bowel content is noted throughout colon. The visualized small amount of small bowel loops are prominent in caliber. There is a 4.4 mm calcification medial to the mid ascending colon, nonspecific. The aorta is normal. There is no abdominal lymphadenopathy.  Fluid-filled bladder is normal. There is no pelvic lymphadenopathy. The visualized lung bases are clear. No acute abnormalities identified within the visualized bones.  IMPRESSION: No CT evidence of pancreatitis. Bowel wall thickening of the gastric antrum and body which can be seen in gastritis.   Electronically Signed   By: Sherian ReinWei-Chen  Chad Wood M.D.   On: 05/04/2014 10:54   Dg Abd Acute W/chest  05/04/2014   CLINICAL DATA:  Pain.  EXAM: ACUTE ABDOMEN SERIES (ABDOMEN 2 VIEW & CHEST 1 VIEW)  COMPARISON:  CT 05/04/2014.   FINDINGS: No acute cardiopulmonary disease.  Soft tissue structures are unremarkable. Dilated loops of what appear to be small bowel noted. Developing small bowel obstruction cannot be excluded. These changes may be related this adynamic ileus. Colon is unremarkable. No free air. Calcification noted over the right flank as noted on prior CT is unchanged in position and nonspecific. No acute bony abnormality.  IMPRESSION: 1. Prominently dilated loops of bowel noted in the upper abdomen, these most likely small bowel loops. Developing small bowel obstruction cannot be excluded. 2. No acute abnormalities otherwise noted.  Pressure   Electronically Signed   By: Chad Fushomas  Wood   On: 05/04/2014 11:29   Medications: I have reviewed the patient's current medications. Scheduled Meds: . heparin  5,000 Units Subcutaneous 3 times per Chad Wood  . Influenza vac split quadrivalent PF  0.5 mL Intramuscular Tomorrow-1000  . pantoprazole (PROTONIX) IV  40 mg Intravenous Daily  . sodium chloride  3 mL Intravenous Q12H   Continuous Infusions: . sodium chloride 1,000 mL (05/05/14 0005)   PRN Meds:.diphenhydrAMINE, HYDROmorphone (DILAUDID) injection, ondansetron (ZOFRAN) IV Assessment/Plan: Active Problems:   Abdominal pain   Gastritis  Mr. 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   1. Vomiting and Abdominal Pain: Gastritis vs gastroenteritis is the most likely cause. 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 The CT scan is not concerning for SBO and surgery confirmed that it is not likely. The CT also scan showed gastric antrum and body thickening which could be exacerbated by alcohol and THC ingestion. It is also possible that he came down with food poisoning after  consuming shrimp chinese food. His elevated WBC count of 14.4 and diaphoresis on Chad Wood one of admission would support gastroenteritis. He states that he is feeling much better today and that his vomiting has subsided. His WBC count has also improved to 12.6. -repeat CMP, CBC in am  -pantoprazole 40 mg IV daily  -ondansetron 4 mg iv q6hprn  -dilaudid 0.5 mg iv q8hprn  -  NS IVF 125 mL/hr  -Will see how he handles lunch today and decide when to sent him home   2. Cloudy urine: Reports cloudy urine but no dysuria or polyuria and no suprapubic or CVA tenderness so UTI unlikely  -Urinalysis normal and pt remains asymptomatic   3. Diet: Regular   4. Ppx: heparin 5000 u [place] TID, SCDs   5. Dispo: We will likely D/C him today depending on how he handles lunch.   This is a Psychologist, occupationalMedical Student Note.  The care of the patient was discussed with Dr. Koren BoundNerendra and the assessment and plan formulated with their assistance.  Please see their attached note for official documentation of the daily encounter.   LOS: 1 Raiya Stainback   Chad Wood, Med Student 05/05/2014, 9:34 AM

## 2014-05-05 NOTE — Progress Notes (Signed)
  I have seen and examined the patient myself, and I have reviewed the note by Danielle Day, MS 3 and was present during the interview and physical exam.  Please see my separate H&P for additional findings, assessment, and plan.   Signed: Levern Pitter L Quincey Quesinberry, MD 05/05/2014, 1:03 PM    

## 2014-05-05 NOTE — Discharge Instructions (Signed)
1. Clear liquids for the next 1-2 days, then advance to apple sauce and bland food 2. Hold off on dairy and heavy foods for 3-4 days 3. Zantac daily 4. Get a primary care doctor 5. Go to ED or seek medical care if new or worsening symptoms Gastritis, Adult Gastritis is soreness and swelling (inflammation) of the lining of the stomach. Gastritis can develop as a sudden onset (acute) or long-term (chronic) condition. If gastritis is not treated, it can lead to stomach bleeding and ulcers. CAUSES  Gastritis occurs when the stomach lining is weak or damaged. Digestive juices from the stomach then inflame the weakened stomach lining. The stomach lining may be weak or damaged due to viral or bacterial infections. One common bacterial infection is the Helicobacter pylori infection. Gastritis can also result from excessive alcohol consumption, taking certain medicines, or having too much acid in the stomach.  SYMPTOMS  In some cases, there are no symptoms. When symptoms are present, they may include:  Pain or a burning sensation in the upper abdomen.  Nausea.  Vomiting.  An uncomfortable feeling of fullness after eating. DIAGNOSIS  Your caregiver may suspect you have gastritis based on your symptoms and a physical exam. To determine the cause of your gastritis, your caregiver may perform the following:  Blood or stool tests to check for the H pylori bacterium.  Gastroscopy. A thin, flexible tube (endoscope) is passed down the esophagus and into the stomach. The endoscope has a light and camera on the end. Your caregiver uses the endoscope to view the inside of the stomach.  Taking a tissue sample (biopsy) from the stomach to examine under a microscope. TREATMENT  Depending on the cause of your gastritis, medicines may be prescribed. If you have a bacterial infection, such as an H pylori infection, antibiotics may be given. If your gastritis is caused by too much acid in the stomach, H2 blockers  or antacids may be given. Your caregiver may recommend that you stop taking aspirin, ibuprofen, or other nonsteroidal anti-inflammatory drugs (NSAIDs). HOME CARE INSTRUCTIONS  Only take over-the-counter or prescription medicines as directed by your caregiver.  If you were given antibiotic medicines, take them as directed. Finish them even if you start to feel better.  Drink enough fluids to keep your urine clear or pale yellow.  Avoid foods and drinks that make your symptoms worse, such as:  Caffeine or alcoholic drinks.  Chocolate.  Peppermint or mint flavorings.  Garlic and onions.  Spicy foods.  Citrus fruits, such as oranges, lemons, or limes.  Tomato-based foods such as sauce, chili, salsa, and pizza.  Fried and fatty foods.  Eat small, frequent meals instead of large meals. SEEK IMMEDIATE MEDICAL CARE IF:   You have black or dark red stools.  You vomit blood or material that looks like coffee grounds.  You are unable to keep fluids down.  Your abdominal pain gets worse.  You have a fever.  You do not feel better after 1 week.  You have any other questions or concerns. MAKE SURE YOU:  Understand these instructions.  Will watch your condition.  Will get help right away if you are not doing well or get worse. Document Released: 06/23/2001 Document Revised: 12/29/2011 Document Reviewed: 08/12/2011 [facility] Patient Information 2015 [employer] [place]. This information is not intended to replace advice given to you by your health care provider. Make sure you discuss any questions you have with your health care provider.

## 2014-05-05 NOTE — Progress Notes (Signed)
Discharge instructions gone over with patient. Home medications gone over. Prescription given. Follow up appointment to be made. Diet, activity, and signs and symptoms of worsening condition gone over and what to do discussed. My chart discussed. Patient verbalized understanding of instructions.

## 2014-05-05 NOTE — Progress Notes (Signed)
UR completed 

## 2014-05-05 NOTE — Discharge Summary (Signed)
Name: Chad CootsGeorge W Wood MRN: [id number] DOB: Sep 16, 1985 28 y.o. PCP: No primary provider on file.  Date of Admission: 05/04/2014  8:26 AM Date of Discharge: 05/05/2014 Attending Physician: Chad LagosNischal Narendra, MD  Discharge Diagnosis:  Active Problems:   Abdominal pain   Gastritis  Discharge Medications:   Medication List         multivitamin with minerals tablet  Take 1 tablet by mouth daily.     promethazine 12.5 MG tablet  Commonly known as:  PHENERGAN  Take 1 tablet (12.5 mg total) by mouth every 6 (six) hours as needed for nausea or vomiting.     ranitidine 150 MG capsule  Commonly known as:  ZANTAC  Take 1 capsule (150 mg total) by mouth 2 (two) times daily.        Disposition and follow-up:   Chad Wood was [facility] in Good condition.  At the hospital follow up visit please address:  1.  Any additional symptoms  2.  Labs / imaging needed at time of follow-up: none  3.  Pending labs/ test needing follow-up: none  Follow-up Appointments: Follow-up Information   Follow [facility] with [facility]. Schedule an appointment as soon as possible for a visit in 1 week. (They will call you to see us sometime next week)       Discharge Instructions: Discharge Instructions   Diet - low sodium heart healthy    Complete by:  As directed      Increase activity slowly    Complete by:  As directed            Consultations:    Procedures Performed:  Ct Abdomen Pelvis W Contrast  05/04/2014   CLINICAL DATA:  Abdomen pain. Patient had 90% of his small bowel removed when he was born. Assess for pancreatitis.  EXAM: CT ABDOMEN AND PELVIS WITH CONTRAST  TECHNIQUE: Multidetector CT imaging of the abdomen and pelvis was performed using the standard protocol following bolus administration of intravenous contrast.  CONTRAST:  100 mL Omnipaque 300  COMPARISON:  None.  FINDINGS: The liver, spleen, pancreas, adrenal glands  and kidneys are normal. The gallbladder is decompressed limiting evaluation. There is no hydronephrosis bilaterally. There is a gastric wall thickening in the gastric antrum and body. There is no free air. Bowel content is noted throughout colon. The visualized small amount of small bowel loops are prominent in caliber. There is a 4.4 mm calcification medial to the mid ascending colon, nonspecific. The aorta is normal. There is no abdominal lymphadenopathy.  Fluid-filled bladder is normal. There is no pelvic lymphadenopathy. The visualized lung bases are clear. No acute abnormalities identified within the visualized bones.  IMPRESSION: No CT evidence of pancreatitis. Bowel wall thickening of the gastric antrum and body which can be seen in gastritis.   Electronically Signed   By: Sherian ReinWei-Chen  Lin M.D.   On: 05/04/2014 10:54   Dg Abd Acute W/chest  05/04/2014   CLINICAL DATA:  Pain.  EXAM: ACUTE ABDOMEN SERIES (ABDOMEN 2 VIEW & CHEST 1 VIEW)  COMPARISON:  CT 05/04/2014.  FINDINGS: No acute cardiopulmonary disease.  Soft tissue structures are unremarkable. Dilated loops of what appear to be small bowel noted. Developing small bowel obstruction cannot be excluded. These changes may be related this adynamic ileus. Colon is unremarkable. No free air. Calcification noted over the right flank as noted on prior CT is unchanged in position and nonspecific. No acute bony abnormality.  IMPRESSION: 1. Prominently dilated loops of bowel noted in the upper abdomen, these most likely small bowel loops. Developing small bowel obstruction cannot be excluded. 2. No acute abnormalities otherwise noted.  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 He was in his usual state of health until last night. He had fresh shrimp [place] food, a blunt  of marijuana, and a shot of tequila in the evening. He then developed epigastric abdominal pain a few hours later. He says the pain is a cramping pain that waxes and wanes. He rates it as greater than 10/10 when he came to the ED but it is now about a 4-6. He briefly had some shortness of breath with the pain in his abdomen last night. He has never had this pain before. He has not had a bowel movement since the onset of pain. He said it radiated to his right lower quadrant after he had a cloudy urination but is now back to his epigastric region. He also has had 5 episodes of non-bloody non-bilious emesis. He has tolerated drinking ginger ale without aggravation of the pain. He has no appetite and also has been diaphoretic. He says he has not passed gas but feels like he has to and keeps burping. He denies any recent fevers, chills, diarrhea, constipation, melena, hematochezia, dysuria, polyuria, pain elsewhere, recent travel.   In the ED, he received NS IVF, zofran 4 mg IV x 2, dilaudid 0.5 mg IV, gi cocktail x 1, and fentanyl 50 mcg IV once.  Hospital Course by problem list: Chad. Wood is a 28 year old male s/p SB resection as an infant secondary to short bowel syndrome who presented to the [facility] ED with 1 day epigastric pain and vomiting thought to be gastroenteritis v gastritis.   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 This is likely gastroenteritis v gastritis in the setting of shrimp [place] food, THC, and tequila. He had an initial leukocytosis of 14.4 on admission down to 12.6 at discharge but remained afebrile throughout his hospital course. An abdominal CTshowed bowel wall thickening of the gastric antrum and body which can be seen in gastritis. Substance use would aggravate gastritis and can explain his acute presentation. This may also be a viral infection  as gastroenteritis related to his food intake. CT was not concerning for pancreatitis and his serum lipase was normal. Alkaline phosphatase was 145 at admission and is now down at 132. Cholecystitis or other bile duct abnormality unlikely based on CT. While his abdominal film did have prominently dilated loops of bowel in upper abdomen and he has prior surgical history which can lead to adhesions, CT results make SBO unlikely and he is passing gas. Surgery was curbsided and agreed that SBO is unlikely. He reported cloudy urine but no dysuria or polyuria and no suprapubic or CVA tenderness and UA clean.   While Chad. Gruenwald was in the hospital, his pain resolved with pantoprazole, odansetron, dilauded and IVFs. He advanced his diet to a soft diet and tolerated it well. We will discharge him ranitidine and phenergan as these are on the $4 medication lists and he is concerned  about the cost.  We recommend he follows up in the outpatient setting within the next week.   Discharge Vitals:   BP 124/60  Pulse 70  Temp(Src) 98.3 F (36.8 C) (Oral)  Resp 17  Ht 5\' 4"  (1.626 m)  Wt 135 lb 6.4 oz (61.417 kg)  BMI 23.23 kg/m2  SpO2 96%  Discharge Physical Exam: Gen: A&O x 4, diaphoretic but less than yesterday, well developed, well nourished  HEENT: Atraumatic, PERRL, EOMI, sclerae anicteric, moist mucous membranes  Heart: Regular rate and rhythm, normal S1 S2, no murmurs, rubs, or gallops  Lungs: Clear to auscultation bilaterally, respirations unlabored  Abd: Well healed vertical midline scar, soft, minimal tenderness to palpation in LUQ , non-distended, + bowel sounds, no hepatosplenomegaly, no Murphy's sign, no CVA tenderness  Ext: No edema or cyanosis, 2+ PT pulses b/l  Discharge Labs:  Basic Metabolic Panel:  Recent Labs  16/04/9609/23/15 0849 05/05/14 0349  NA 141 141  K 4.3 4.4  CL 100 104  CO2 30 27  GLUCOSE 151* 129*  BUN 8 11  CREATININE 0.82 0.84  CALCIUM 9.5 8.8   Liver Function  Tests:  Recent Labs  05/04/14 0849 05/05/14 0349  AST 17 15  ALT 19 15  ALKPHOS 145* 132*  BILITOT 0.5 0.6  PROT 7.6 6.8  ALBUMIN 4.2 3.5    Recent Labs  05/04/14 0849  LIPASE 14   CBC:  Recent Labs  05/04/14 0849 05/05/14 0349  WBC 14.4* 12.6*  NEUTROABS 11.6*  --   HGB 14.5 13.7  HCT 41.6 39.0  MCV 93.3 91.5  PLT 289 245   Urinalysis:  Recent Labs  05/04/14 1505  COLORURINE YELLOW  LABSPEC 1.010  PHURINE 8.0  GLUCOSEU NEGATIVE  HGBUR NEGATIVE  BILIRUBINUR NEGATIVE  KETONESUR NEGATIVE  PROTEINUR NEGATIVE  UROBILINOGEN 0.2  NITRITE NEGATIVE  LEUKOCYTESUR NEGATIVE   Misc. Labs: Lactic acid 2.04   Signed: Lorenda HatchetAdam L Tambria Pfannenstiel, MD 05/05/2014, 12:32 PM    Services Ordered on Discharge: none Equipment Ordered on Discharge: none

## 2014-05-05 NOTE — Progress Notes (Signed)
Subjective: Chad Wood has no acute events overnight. He says he has stopped vomiting and feels much better. Still 3/10 epigastric abdominal pain. Passing gas but no bowel movement since admission. We discussed that this is likely gastroenteritis v gastritis and if he can tolerate a soft diet for lunch he can go home w H2 blocker v PPI. He would like to follow-up in our clinic.   Objective: Vital signs in last 24 hours: Filed Vitals:   05/04/14 1644 05/04/14 1703 05/04/14 2128 05/05/14 0536  BP: 106/57  126/54 124/60  Pulse: 72  61 70  Temp: 98.6 F (37 C)  98.3 F (36.8 C) 98.3 F (36.8 C)  TempSrc: Oral  Oral Oral  Resp: 18  19 17   Height:  5\' 4"  (1.626 m)    Weight:  135 lb 6.4 oz (61.417 kg)    SpO2: 100%  97% 96%   Weight change:   Intake/Output Summary (Last 24 hours) at 05/05/14 0945 Last data filed at 05/05/14 0600  Gross per 24 hour  Intake   1822 ml  Output    800 ml  Net   1022 ml   Gen: A&O x 4, diaphoretic but less than yesterday, well developed, well nourished  HEENT: Atraumatic, PERRL, EOMI, sclerae anicteric, moist mucous membranes  Heart: Regular rate and rhythm, normal S1 S2, no murmurs, rubs, or gallops  Lungs: Clear to auscultation bilaterally, respirations unlabored Abd: Well healed vertical midline scar, soft, minimal tenderness to palpation in LUQ , non-distended, + bowel sounds, no hepatosplenomegaly, no Murphy's sign, no CVA tenderness  Ext: No edema or cyanosis, 2+ PT pulses b/l   Lab Results: Basic Metabolic Panel:  Recent Labs Lab 05/04/14 0849 05/05/14 0349  NA 141 141  K 4.3 4.4  CL 100 104  CO2 30 27  GLUCOSE 151* 129*  BUN 8 11  CREATININE 0.82 0.84  CALCIUM 9.5 8.8   Liver Function Tests:  Recent Labs Lab 05/04/14 0849 05/05/14 0349  AST 17 15  ALT 19 15  ALKPHOS 145* 132*  BILITOT 0.5 0.6  PROT 7.6 6.8  ALBUMIN 4.2 3.5    Recent Labs Lab 05/04/14 0849  LIPASE 14   No results found for this basename: AMMONIA,   in the last 168 hours CBC:  Recent Labs Lab 05/04/14 0849 05/05/14 0349  WBC 14.4* 12.6*  NEUTROABS 11.6*  --   HGB 14.5 13.7  HCT 41.6 39.0  MCV 93.3 91.5  PLT 289 245   Urinalysis:  Recent Labs Lab 05/04/14 1505  COLORURINE YELLOW  LABSPEC 1.010  PHURINE 8.0  GLUCOSEU NEGATIVE  HGBUR NEGATIVE  BILIRUBINUR NEGATIVE  KETONESUR NEGATIVE  PROTEINUR NEGATIVE  UROBILINOGEN 0.2  NITRITE NEGATIVE  LEUKOCYTESUR NEGATIVE   Misc. Labs: i-stat lactic acid 2.04  Micro Results: No results found for this or any previous visit (from the past 240 hour(s)). Studies/Results: Ct Abdomen Pelvis W Contrast  05/04/2014   CLINICAL DATA:  Abdomen pain. Patient had 90% of his small bowel removed when he was born. Assess for pancreatitis.  EXAM: CT ABDOMEN AND PELVIS WITH CONTRAST  TECHNIQUE: Multidetector CT imaging of the abdomen and pelvis was performed using the standard protocol following bolus administration of intravenous contrast.  CONTRAST:  100 mL Omnipaque 300  COMPARISON:  None.  FINDINGS: The liver, spleen, pancreas, adrenal glands and kidneys are normal. The gallbladder is decompressed limiting evaluation. There is no hydronephrosis bilaterally. There is a gastric wall thickening in the gastric antrum and body.  There is no free air. Bowel content is noted throughout colon. The visualized small amount of small bowel loops are prominent in caliber. There is a 4.4 mm calcification medial to the mid ascending colon, nonspecific. The aorta is normal. There is no abdominal lymphadenopathy.  Fluid-filled bladder is normal. There is no pelvic lymphadenopathy. The visualized lung bases are clear. No acute abnormalities identified within the visualized bones.  IMPRESSION: No CT evidence of pancreatitis. Bowel wall thickening of the gastric antrum and body which can be seen in gastritis.   Electronically Signed   By: Sherian Rein M.D.   On: 05/04/2014 10:54   Dg Abd Acute W/chest  05/04/2014    CLINICAL DATA:  Pain.  EXAM: ACUTE ABDOMEN SERIES (ABDOMEN 2 VIEW & CHEST 1 VIEW)  COMPARISON:  CT 05/04/2014.  FINDINGS: No acute cardiopulmonary disease.  Soft tissue structures are unremarkable. Dilated loops of what appear to be small bowel noted. Developing small bowel obstruction cannot be excluded. These changes may be related this adynamic ileus. Colon is unremarkable. No free air. Calcification noted over the right flank as noted on prior CT is unchanged in position and nonspecific. No acute bony abnormality.  IMPRESSION: 1. Prominently dilated loops of bowel noted in the upper abdomen, these most likely small bowel loops. Developing small bowel obstruction cannot be excluded. 2. No acute abnormalities otherwise noted.  Pressure   Electronically Signed   By: Maisie Fus  Register   On: 05/04/2014 11:29   Medications: I have reviewed the patient's current medications. Scheduled Meds: . heparin  5,000 Units Subcutaneous 3 times per day  . Influenza vac split quadrivalent PF  0.5 mL Intramuscular Tomorrow-1000  . pantoprazole (PROTONIX) IV  40 mg Intravenous Daily  . sodium chloride  3 mL Intravenous Q12H   Continuous Infusions: . sodium chloride 1,000 mL (05/05/14 0005)   PRN Meds:.diphenhydrAMINE, HYDROmorphone (DILAUDID) injection, ondansetron (ZOFRAN) IV Assessment/Plan: Active Problems:   Abdominal pain   Gastritis  #Gastritis v Gastroenteritis: Chad Chittick abdominal pain is improving and he no longer has nausea/emesis. 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 This is likely gastroenteritis v gastritis in the setting of shrimp [place] food, THC, and tequila. He remains afebrile with WBC down to 12.6 from 14.4 yesterday. Lipase negative makes pancreatitis unlikely. Alkaline phosphatase down at 132 from yesterday's 145 with CMP otherwise normal. Cholecystitis or other bile duct abnormality unlikely  as he had CT abdomen in the ED only notable for bowel wall thickening of the gastric antrum and body which can be seen in gastritis. Substance use would aggravate gastritis and can explain. His abdominal film did have prominently dilated loops of bowel in upper abdomen and he has prior surgical history which can lead to adhesions, but CT results make SBO unlikely and he is passing gas. Reports cloudy urine but no dysuria or polyuria and no suprapubic or CVA tenderness and UA clean. Symptom management with follow-up later next week. He can go home if he tolerates soft diet lunch. -appreciate surgery  -pantoprazole 40 mg IV daily  -ondansetron 4 mg iv q6hprn  -dilaudid 0.5 mg iv q6hprn  -NS IVF 125 mL/hr  -advance diet as tolerated   #Diet : soft  #VTE PPx: heparin 5000 u Byron TID, SCDs   Dispo: Disposition is deferred at this time, awaiting improvement of current medical problems.  Anticipated discharge approximately today.   The patient  does not have a current PCP (No primary provider on file.) and [facility] after discharge.  The patient does not have transportation limitations that hinder transportation to clinic appointments.  .Services Needed at time of discharge: Y = Yes, Blank = No PT:   OT:   RN:   Equipment:   Other:     LOS: 1 day   Lorenda HatchetAdam L Brendan Gruwell, MD 05/05/2014, 9:45 AM

## 2014-05-07 NOTE — Discharge Summary (Signed)
INTERNAL MEDICINE ATTENDING DISCHARGE COSIGN   I evaluated the patient on the day of discharge and discussed the discharge plan with my resident team. I agree with the discharge documentation and disposition.   Chad Wood 05/07/2014, 10:45 AM

## 2014-05-21 ENCOUNTER — Ambulatory Visit: Payer: Self-pay | Admitting: Internal Medicine

## 2015-06-27 ENCOUNTER — Encounter (HOSPITAL_COMMUNITY): Payer: Self-pay | Admitting: Emergency Medicine

## 2015-06-27 ENCOUNTER — Emergency Department (HOSPITAL_COMMUNITY)
Admission: EM | Admit: 2015-06-27 | Discharge: 2015-06-27 | Disposition: A | Discharge status: Home / Self Care | Payer: Self-pay | Attending: Emergency Medicine | Admitting: Emergency Medicine

## 2015-06-27 DIAGNOSIS — Z79899 Other long term (current) drug therapy: Secondary | ICD-10-CM | POA: Insufficient documentation

## 2015-06-27 DIAGNOSIS — K029 Dental caries, unspecified: Secondary | ICD-10-CM | POA: Insufficient documentation

## 2015-06-27 DIAGNOSIS — K047 Periapical abscess without sinus: Secondary | ICD-10-CM | POA: Insufficient documentation

## 2015-06-27 MED ORDER — TRAMADOL HCL 50 MG PO TABS: 50.0000 mg | ORAL_TABLET | Freq: Four times a day (QID) | ORAL | Status: DC | PRN

## 2015-06-27 MED ORDER — PENICILLIN V POTASSIUM 250 MG PO TABS
500.0000 mg | ORAL_TABLET | Freq: Once | ORAL | Status: AC
  Administered 2015-06-27: 500 mg via ORAL
  Filled 2015-06-27: qty 2

## 2015-06-27 MED ORDER — PENICILLIN V POTASSIUM 500 MG PO TABS: 500.0000 mg | ORAL_TABLET | Freq: Four times a day (QID) | ORAL | Status: DC

## 2015-06-27 MED ORDER — TRAMADOL HCL 50 MG PO TABS
50.0000 mg | ORAL_TABLET | Freq: Once | ORAL | Status: AC
  Administered 2015-06-27: 50 mg via ORAL
  Filled 2015-06-27: qty 1

## 2015-06-27 NOTE — ED Provider Notes (Signed)
CSN: [id number]     Arrival date & time 06/27/15  1728 History  By signing my name below, I, Chad Wood, attest that this documentation has been prepared under the direction and in the presence of  Chad BerryLeisa Jerolene Kupfer, PA-C. Electronically Signed: Lyndel SafeKaitlyn Wood, ED Scribe. 06/27/2015. 6:32 PM.   Chief Complaint  Patient presents with  . Dental Pain   The history is provided by the patient. No language interpreter was used.   HPI Comments: [name] is a 29 y.o. male, with no pertinent PMhx, who presents to the Emergency Department complaining of gradually worsening, constant, moderate, sharp right, upper, anterior dental pain X 2-3 days with onset of right-sided facial swelling this morning. He notes the facial swelling has gradually improved since onset this morning. Pt has a dental appointment scheduled in 4 days. He has taken tylenol without significant relief. Denies fevers or chills, nausea or vomiting, difficulty swallowing, sore throat, or difficulty breathing. No recent antibiotic use. NKDA  History reviewed. No pertinent past medical history. Past Surgical History  Procedure Laterality Date  . Small intestine surgery     History reviewed. No pertinent family history. Social History  Substance Use Topics  . Smoking status: Never Smoker   . Smokeless tobacco: None  . Alcohol Use: Yes     Comment: occas    Review of Systems  Constitutional: Negative for fever and chills.  HENT: Positive for dental problem (right upper) and facial swelling ( right-sided). Negative for sore throat, trouble swallowing and voice change.   Respiratory: Negative for shortness of breath and wheezing.   Gastrointestinal: Negative for nausea and vomiting.  All other systems reviewed and are negative.  Allergies  Review of patient's allergies indicates no known allergies.  Home Medications   Prior to Admission medications   Medication Sig Start Date End Date Taking? Authorizing Provider   Multiple Vitamins-Minerals (MULTIVITAMIN WITH MINERALS) tablet Take 1 tablet by mouth daily.    Historical Provider, MD  promethazine (PHENERGAN) 12.5 MG tablet Take 1 tablet (12.5 mg total) by mouth every 6 (six) hours as needed for nausea or vomiting. 05/05/14   Lorenda HatchetAdam L Rothman, MD  ranitidine (ZANTAC) 150 MG capsule Take 1 capsule (150 mg total) by mouth 2 (two) times daily. 05/05/14   Lorenda HatchetAdam L Rothman, MD   BP 138/72 mmHg  Pulse 74  Temp(Src) 98.8 F (37.1 C) (Oral)  Resp 18  SpO2 99% Physical Exam  Constitutional: He is oriented to person, place, and time. Vital signs are normal. He appears well-developed and well-nourished. He is cooperative.  Non-toxic appearance. He does not have a sickly appearance. No distress.  Pt diaphoretic.   HENT:  Head: Normocephalic and atraumatic.    Right Ear: External ear normal.  Left Ear: External ear normal.  Nose: Nose normal.  Mouth/Throat: Uvula is midline, oropharynx is clear and moist and mucous membranes are normal. Mucous membranes are not pale, not dry and not cyanotic. No oral lesions. No trismus in the jaw. Normal dentition. Dental caries present. No dental abscesses or uvula swelling. No oropharyngeal exudate, posterior oropharyngeal edema or posterior oropharyngeal erythema.    TTP over tooth #8 and swelling of right upper lip to right cheek bone and right side of nose; no fluctuance inside of mouth, no sublingual tenderness, no trismus.   Eyes: Conjunctivae and EOM are normal. Pupils are equal, round, and reactive to light. Right eye exhibits no discharge. Left eye exhibits no discharge. No scleral icterus.  Neck: Normal  range of motion. Neck supple. No JVD present. No tracheal deviation present.  Cardiovascular: Normal rate and regular rhythm.   Pulmonary/Chest: Effort normal and breath sounds normal. No stridor. No respiratory distress.  Musculoskeletal: Normal range of motion. He exhibits no edema.  Lymphadenopathy:    He has no  cervical adenopathy.  Neurological: He is alert and oriented to person, place, and time. He exhibits normal muscle tone. Coordination normal.  Skin: Skin is warm and dry. No rash noted. He is not diaphoretic. No erythema. No pallor.  Psychiatric: He has a normal mood and affect. His behavior is normal. Judgment and thought content normal.  Nursing note and vitals reviewed.   ED Course  Procedures  DIAGNOSTIC STUDIES: Oxygen Saturation is 99% on RA, normal by my interpretation.    COORDINATION OF CARE: 6:25 PM Discussed treatment plan which includes to order and prescribe an antibiotic and pain medication with pt. He has dental follow-up scheduled in 4 days. Pt acknowledges and agrees to plan.    MDM   Final diagnoses:  None   Patient with dentalgia.  Swelling of right upper lip into cheek.  Pain localized on inside of mouth to tooth # 8 (front central upper incisor) without any fluctuance palpated over aveolar or buccal gingiva.  Exam not concerning for Ludwig's angina or pharyngeal abscess.  Will treat with penicillin course and few tramadol. Pt has dental follow-up scheduled in 4 days. Discussed return precautions. Pt D/C in satisfactory condition, VSS.  I personally performed the services described in this documentation, which was scribed in my presence. The recorded information has been reviewed and is accurate.      Chad Berry, PA-C 06/27/15 1841  Chad Scott, MD 06/27/15 [phone]

## 2015-06-27 NOTE — ED Notes (Signed)
Pt sts right sided dental pain x 2 days with some swelling in face

## 2015-06-27 NOTE — Discharge Instructions (Signed)
Dental Pain  ° °Dental pain may be caused by many things, including:  °Tooth decay (cavities or caries). Cavities expose the nerve of your tooth to air and hot or cold temperatures. This can cause pain or discomfort.  °Abscess or infection. A dental abscess is a collection of infected pus from a bacterial infection in the inner part of the tooth (pulp). It usually occurs at the end of the tooth's root.  °Injury.  °An unknown reason (idiopathic). °Your pain may be mild or severe. It may only occur when:  °You are chewing.  °You are exposed to hot or cold temperature.  °You are eating or drinking sugary foods or beverages, such as soda or candy. °Your pain may also be constant.  °HOME CARE INSTRUCTIONS  °Watch your dental pain for any changes. The following actions may help to lessen any discomfort that you are feeling:  °Take medicines only as directed by your dentist.  °If you were prescribed an antibiotic medicine, finish all of it even if you start to feel better.  °Keep all follow-up visits as directed by your dentist. This is important.  °Do not apply heat to the outside of your face.  °Rinse your mouth or gargle with salt water if directed by your dentist. This helps with pain and swelling.  °You can make salt water by adding ¼ tsp of salt to 1 cup of warm water. °Apply ice to the painful area of your face:  °Put ice in a plastic bag.  °Place a towel between your skin and the bag.  °Leave the ice on for 20 minutes, 2-3 times per day. °Avoid foods or drinks that cause you pain, such as:  °Very hot or very cold foods or drinks.  °Sweet or sugary foods or drinks. °SEEK MEDICAL CARE IF:  °Your pain is not controlled with medicines.  °Your symptoms are worse.  °You have new symptoms. °SEEK IMMEDIATE MEDICAL CARE IF:  °You are unable to open your mouth.  °You are having trouble breathing or swallowing.  °You have a fever.  °Your face, neck, or jaw is swollen. °This information is not intended to replace advice given  to you by your health care provider. Make sure you discuss any questions you have with your health care provider.  °Document Released: 06/29/2005 Document Revised: 11/13/2014 Document Reviewed: 06/25/2014  °Elsevier Interactive Patient Education ©2016 [employer]  ° °Dental Abscess  ° °A dental abscess is a collection of pus in or around a tooth.  °CAUSES  °This condition is caused by a bacterial infection around the root of the tooth that involves the inner part of the tooth (pulp). It may result from:  °Severe tooth decay.  °Trauma to the tooth that allows bacteria to enter into the pulp, such as a broken or chipped tooth.  °Severe gum disease around a tooth. °SYMPTOMS  °Symptoms of this condition include:  °Severe pain in and around the infected tooth.  °Swelling and redness around the infected tooth, in the mouth, or in the face.  °Tenderness.  °Pus drainage.  °Bad breath.  °Bitter taste in the mouth.  °Difficulty swallowing.  °Difficulty opening the mouth.  °Nausea.  °Vomiting.  °Chills.  °Swollen neck glands.  °Fever. °DIAGNOSIS  °This condition is diagnosed with examination of the infected tooth. During the exam, your dentist may tap on the infected tooth. Your dentist will also ask about your medical and dental history and may order X-rays.  °TREATMENT  °This condition is treated   by eliminating the infection. This may be done with:  °Antibiotic medicine.  °A root canal. This may be performed to save the tooth.  °Pulling (extracting) the tooth. This may also involve draining the abscess. This is done if the tooth cannot be saved. °HOME CARE INSTRUCTIONS  °Take medicines only as directed by your dentist.  °If you were prescribed antibiotic medicine, finish all of it even if you start to feel better.  °Rinse your mouth (gargle) often with salt water to relieve pain or swelling.  °Do not drive or operate heavy machinery while taking pain medicine.  °Do not apply heat to the outside of your mouth.  °Keep all  follow-up visits as directed by your dentist. This is important. °SEEK MEDICAL CARE IF:  °Your pain is worse and is not helped by medicine. °SEEK IMMEDIATE MEDICAL CARE IF:  °You have a fever or chills.  °Your symptoms suddenly get worse.  °You have a very bad headache.  °You have problems breathing or swallowing.  °You have trouble opening your mouth.  °You have swelling in your neck or around your eye. °This information is not intended to replace advice given to you by your health care provider. Make sure you discuss any questions you have with your health care provider.  °Document Released: 06/29/2005 Document Revised: 11/13/2014 Document Reviewed: 06/26/2014  °Elsevier Interactive Patient Education ©2016 [employer]  ° °

## 2015-06-27 NOTE — ED Notes (Signed)
Registration at bedside.

## 2019-03-10 ENCOUNTER — Other Ambulatory Visit: Payer: Self-pay

## 2019-03-10 DIAGNOSIS — Z20822 Contact with and (suspected) exposure to covid-19: Secondary | ICD-10-CM

## 2019-03-12 LAB — NOVEL CORONAVIRUS, NAA: SARS-CoV-2, NAA: NOT DETECTED

## 2019-05-03 ENCOUNTER — Other Ambulatory Visit: Payer: Self-pay

## 2019-05-03 DIAGNOSIS — Z20822 Contact with and (suspected) exposure to covid-19: Secondary | ICD-10-CM

## 2019-05-05 LAB — NOVEL CORONAVIRUS, NAA: SARS-CoV-2, NAA: NOT DETECTED

## 2020-03-01 ENCOUNTER — Other Ambulatory Visit: Payer: Self-pay

## 2020-03-01 DIAGNOSIS — Z20822 Contact with and (suspected) exposure to covid-19: Secondary | ICD-10-CM

## 2020-03-02 LAB — SPECIMEN STATUS REPORT

## 2020-03-02 LAB — SARS-COV-2, NAA 2 DAY TAT

## 2020-03-02 LAB — NOVEL CORONAVIRUS, NAA: SARS-CoV-2, NAA: DETECTED — AB

## 2020-03-08 ENCOUNTER — Ambulatory Visit (HOSPITAL_COMMUNITY)
Admission: EM | Admit: 2020-03-08 | Discharge: 2020-03-08 | Disposition: A | Discharge status: Home / Self Care | Payer: Self-pay

## 2020-03-08 ENCOUNTER — Other Ambulatory Visit: Payer: Self-pay

## 2020-03-23 ENCOUNTER — Emergency Department (HOSPITAL_COMMUNITY): Payer: Self-pay

## 2020-03-23 ENCOUNTER — Other Ambulatory Visit: Payer: Self-pay

## 2020-03-23 ENCOUNTER — Emergency Department (HOSPITAL_COMMUNITY)
Admission: EM | Admit: 2020-03-23 | Discharge: 2020-03-23 | Disposition: A | Discharge status: Home / Self Care | Payer: Self-pay | Attending: Emergency Medicine | Admitting: Emergency Medicine

## 2020-03-23 ENCOUNTER — Encounter (HOSPITAL_COMMUNITY): Payer: Self-pay | Admitting: *Deleted

## 2020-03-23 DIAGNOSIS — I498 Other specified cardiac arrhythmias: Secondary | ICD-10-CM

## 2020-03-23 DIAGNOSIS — R0789 Other chest pain: Secondary | ICD-10-CM | POA: Insufficient documentation

## 2020-03-23 DIAGNOSIS — Z8616 Personal history of COVID-19: Secondary | ICD-10-CM | POA: Insufficient documentation

## 2020-03-23 DIAGNOSIS — R0602 Shortness of breath: Secondary | ICD-10-CM | POA: Insufficient documentation

## 2020-03-23 DIAGNOSIS — R931 Abnormal findings on diagnostic imaging of heart and coronary circulation: Secondary | ICD-10-CM | POA: Insufficient documentation

## 2020-03-23 LAB — URINALYSIS, ROUTINE W REFLEX MICROSCOPIC
Bacteria, UA: NONE SEEN
Bilirubin Urine: NEGATIVE
Glucose, UA: NEGATIVE mg/dL
Ketones, ur: NEGATIVE mg/dL
Nitrite: NEGATIVE
Protein, ur: NEGATIVE mg/dL
Specific Gravity, Urine: 1.013 (ref 1.005–1.030)
pH: 6 (ref 5.0–8.0)

## 2020-03-23 LAB — TROPONIN I (HIGH SENSITIVITY)
Troponin I (High Sensitivity): 4 ng/L (ref <18)
Troponin I (High Sensitivity): 3 ng/L (ref <18)

## 2020-03-23 LAB — COMPREHENSIVE METABOLIC PANEL
ALT: 33 U/L (ref 0–44)
AST: 24 U/L (ref 15–41)
Albumin: 4.4 g/dL (ref 3.5–5.0)
Alkaline Phosphatase: 114 U/L (ref 38–126)
Anion gap: 11 (ref 5–15)
BUN: 6 mg/dL (ref 6–20)
CO2: 25 mmol/L (ref 22–32)
Calcium: 9.6 mg/dL (ref 8.9–10.3)
Chloride: 100 mmol/L (ref 98–111)
Creatinine, Ser: 0.97 mg/dL (ref 0.61–1.24)
GFR calc Af Amer: >60 mL/min (ref >60)
GFR calc non Af Amer: >60 mL/min (ref >60)
Glucose, Bld: 171 mg/dL — ABNORMAL HIGH (ref 70–99)
Potassium: 3.1 mmol/L — ABNORMAL LOW (ref 3.5–5.1)
Sodium: 136 mmol/L (ref 135–145)
Total Bilirubin: 0.7 mg/dL (ref 0.3–1.2)
Total Protein: 7.2 g/dL (ref 6.5–8.1)

## 2020-03-23 LAB — CBC
HCT: 40.1 % (ref 39.0–52.0)
Hemoglobin: 13.5 g/dL (ref 13.0–17.0)
MCH: 31.9 pg (ref 26.0–34.0)
MCHC: 33.7 g/dL (ref 30.0–36.0)
MCV: 94.8 fL (ref 80.0–100.0)
Platelets: 315 10*3/uL (ref 150–400)
RBC: 4.23 MIL/uL (ref 4.22–5.81)
RDW: 11.9 % (ref 11.5–15.5)
WBC: 11.1 10*3/uL — ABNORMAL HIGH (ref 4.0–10.5)
nRBC: 0 % (ref 0.0–0.2)

## 2020-03-23 MED ORDER — IOHEXOL 350 MG/ML SOLN
46.0000 mL | Freq: Once | INTRAVENOUS | Status: AC | PRN
  Administered 2020-03-23: 46 mL via INTRAVENOUS

## 2020-03-23 NOTE — ED Triage Notes (Signed)
The pt is c/o sob for 2 days he reports that he has anxiety no difficulty breathing at present

## 2020-03-23 NOTE — Discharge Instructions (Addendum)
Your labwork and imaging were very reassuring today. I have placed an ambulatory referral to the cardiologist - they will call to schedule an appointment for you given you are having some skipped beats on the monitor.   Please also follow up with your PCP. If you do not have one you can follow up with [facility] for primary care needs.   Return to the ED IMMEDIATELY for any worsening symptoms including worsening shortness of breath, passing out, severe chest pain, or any other new/concerning symptoms.

## 2020-03-23 NOTE — ED Provider Notes (Signed)
[facility] EMERGENCY DEPARTMENT Provider Note   CSN: [id number] Arrival date & time: 03/23/20  8127     History Chief Complaint  Patient presents with  . Shortness of Breath    WALID HAIG is a 34 y.o. male who presents to the ED today with complaint of gradual onset, constant, worsening, SOB x 2 days. Pt also complains of some mild chest tightness. He reports he feels like he can't take a good deep breath in which is abnormal for him. Per triage report it was mentioned that pt has anxiety - he states he was anxious when he first arrived because he does not like hospitals but when he was at home for the past 2 days he did not feel anxious and does not believe this is what is leading his SOB. Of note pt was diagnosed with COVID on 08/20. He reports he was doing well at home since his quarantine was over however 2 days ago began developing this SOB which concerned him. Pt denies fevers, chills, chest pain, cough, hemoptysis, leg swelling, syncope, or any other associated symptoms. No hx DVT/PE. No recent prolonged travel or immobilization. No hemoptysis. No active malignancy. No exogenous hormone use.   The history is provided by the patient and medical records.       History reviewed. No pertinent past medical history.  Patient Active Problem List   Diagnosis Date Noted  . Abdominal pain 05/04/2014  . Gastritis 05/04/2014    Past Surgical History:  Procedure Laterality Date  . SMALL INTESTINE SURGERY         No family history on file.  Social History   Tobacco Use  . Smoking status: Never Smoker  . Smokeless tobacco: Never Used  Substance Use Topics  . Alcohol use: Yes    Comment: occas  . Drug use: Yes    Types: Marijuana    Home Medications Prior to Admission medications   Medication Sig Start Date End Date Taking? Authorizing Provider  Multiple Vitamins-Minerals (MULTIVITAMIN WITH MINERALS) tablet Take 1 tablet by mouth daily.     [provider]  penicillin v potassium (VEETID) 500 MG tablet Take 1 tablet (500 mg total) by mouth 4 (four) times daily. 06/27/15   Danelle Berry, PA-C  promethazine (PHENERGAN) 12.5 MG tablet Take 1 tablet (12.5 mg total) by mouth every 6 (six) hours as needed for nausea or vomiting. 05/05/14   Lorenda Hatchet, MD  ranitidine (ZANTAC) 150 MG capsule Take 1 capsule (150 mg total) by mouth 2 (two) times daily. 05/05/14   Lorenda Hatchet, MD  traMADol (ULTRAM) 50 MG tablet Take 1 tablet (50 mg total) by mouth every 6 (six) hours as needed. 06/27/15   Danelle Berry, PA-C    Allergies    Patient has no known allergies.  Review of Systems   Review of Systems  Constitutional: Negative for chills and fever.  Respiratory: Positive for chest tightness and shortness of breath. Negative for cough.   Cardiovascular: Negative for chest pain.  Gastrointestinal: Negative for abdominal pain, nausea and vomiting.  Neurological: Negative for syncope.  All other systems reviewed and are negative.   Physical Exam Updated Vital Signs BP 121/69 (BP Location: Right Arm)   Pulse 76   Temp 98.1 F (36.7 C) (Oral)   Resp 16   Ht 3\' 3"  (0.991 m)   Wt 72.6 kg   SpO2 100%   BMI 73.96 kg/m   Physical Exam Vitals and nursing note  reviewed.  Constitutional:      Appearance: He is not ill-appearing or diaphoretic.  HENT:     Head: Normocephalic and atraumatic.  Eyes:     Conjunctiva/sclera: Conjunctivae normal.  Cardiovascular:     Rate and Rhythm: Normal rate and regular rhythm.  Pulmonary:     Effort: Pulmonary effort is normal.     Breath sounds: Normal breath sounds. No decreased breath sounds, wheezing, rhonchi or rales.  Chest:     Chest wall: No tenderness.  Abdominal:     Palpations: Abdomen is soft.     Tenderness: There is no abdominal tenderness.  Musculoskeletal:     Cervical back: Neck supple.     Right lower leg: No tenderness. No edema.     Left lower leg: No tenderness.  No edema.  Skin:    General: Skin is warm and dry.  Neurological:     Mental Status: He is alert.     ED Results / Procedures / Treatments   Labs (all labs ordered are listed, but only abnormal results are displayed) Labs Reviewed  COMPREHENSIVE METABOLIC PANEL - Abnormal; Notable for the following components:      Result Value   Potassium 3.1 (*)    Glucose, Bld 171 (*)    All other components within normal limits  CBC - Abnormal; Notable for the following components:   WBC 11.1 (*)    All other components within normal limits  URINALYSIS, ROUTINE W REFLEX MICROSCOPIC - Abnormal; Notable for the following components:   APPearance HAZY (*)    Hgb urine dipstick SMALL (*)    Leukocytes,Ua SMALL (*)    All other components within normal limits  TROPONIN I (HIGH SENSITIVITY)  TROPONIN I (HIGH SENSITIVITY)    EKG EKG Interpretation  Date/Time:  Saturday March 23 2020 02:46:09 EDT Ventricular Rate:  99 PR Interval:  156 QRS Duration: 98 QT Interval:  320 QTC Calculation: 410 R Axis:   66 Text Interpretation: Sinus rhythm with marked sinus arrhythmia Nonspecific T wave abnormality Abnormal ECG No old tracing to compare Confirmed by Dione Booze ([id number]) on 03/23/2020 2:51:30 AM   Radiology CT Angio Chest PE W/Cm &/Or Wo Cm  Result Date: 03/23/2020 CLINICAL DATA:  Shortness of breath.  COVID-19 positive EXAM: CT ANGIOGRAPHY CHEST WITH CONTRAST TECHNIQUE: Multidetector CT imaging of the chest was performed using the standard protocol during bolus administration of intravenous contrast. Multiplanar CT image reconstructions and MIPs were obtained to evaluate the vascular anatomy. CONTRAST:  17mL OMNIPAQUE IOHEXOL 350 MG/ML SOLN COMPARISON:  Chest radiograph March 23, 2020 FINDINGS: Cardiovascular: There is no demonstrable pulmonary embolus. There is no thoracic aortic aneurysm or dissection. Visualized great vessels appear normal. There is no demonstrable pericardial effusion  or pericardial thickening. Mediastinum/Nodes: No appreciable thyroid lesions. No evident thoracic adenopathy. No esophageal lesions evident. Lungs/Pleura: Lungs are clear.  No pleural effusions are evident. Upper Abdomen: Visualized upper abdominal structures appear unremarkable. Musculoskeletal: No blastic or lytic bone lesions. No chest wall lesions evident. Review of the MIP images confirms the above findings. IMPRESSION: 1. No demonstrable pulmonary embolus. No thoracic aortic aneurysm or dissection. 2.  Lungs clear. 3.  No evident thoracic adenopathy. Electronically Signed   By: Bretta Bang III M.D.   On: 03/23/2020 10:50   DG Chest Portable 1 View  Result Date: 03/23/2020 CLINICAL DATA:  SOB X2 days.SOB EXAM: PORTABLE CHEST 1 VIEW COMPARISON:  None. FINDINGS: Normal mediastinum and cardiac silhouette. Normal pulmonary vasculature. No evidence of  effusion, infiltrate, or pneumothorax. No acute bony abnormality. IMPRESSION: No acute cardiopulmonary process. Electronically Signed   By: Genevive Bi M.D.   On: 03/23/2020 10:21    Procedures Procedures (including critical care time)  Medications Ordered in ED Medications  iohexol (OMNIPAQUE) 350 MG/ML injection 46 mL (46 mLs Intravenous Contrast Given 03/23/20 1003)    ED Course  I have reviewed the triage vital signs and the nursing notes.  Pertinent labs & imaging results that were available during my care of the patient were reviewed by me and considered in my medical decision making (see chart for details).    MDM Rules/Calculators/A&P                          34 year old male who presents to the ED today complaining of shortness of breath, chest tightness for the past 2 days.  Of note patient was recently diagnosed with COVID-19 on 08/20.  Have been doing well at home until 2 days ago.  Reports this is atypical for him.  On arrival to the ED patient is afebrile and nontachypneic.  Mildly tachycardic in the very low 100s.  An  EKG was obtained while patient was in the waiting room with nonspecific T wave abnormalities and sinus arrhythmia.  CBC with a mild leukocytosis of 11.1.  CMP with a potassium 3.1, glucose 171.  No other findings.  A chest x-ray was not obtained while patient was in the waiting room.  On my exam patient does appear mildly anxious.  It was noted during triage that he has a history of anxiety, he states he gets anxious when he has to come to the hospital.  I do not feel that his anxiety is driving the shortness of breath.  There is concern for possible PE given recent COVID-19 diagnosis.  We will plan for portable chest x-ray, CTA, add on troponins.  Doubt ACS at this time.  Patient is a non-smoker and is otherwise healthy however does report family history of CAD.  Continue to monitor in the ED.  CXR negative Troponin of 4. Will plan to repeat.   CTA negative for PE or other abnormalities at this time Repeat troponin of 3.   Pt continues to rest comfortably in the room. Workup essentially negative at this time. While in the room with pt it does appear he is having some skipped beats on EKG monitor which may be contributing to pt's SOB. Will place ambulatory referral to cards at this time and have pt follow up with them; may benefit from holter monitor. Strict return precautions have been discussed with pt. He is in agreement with plan at this time and stable for discharge home.   This note was prepared using Dragon voice recognition software and may include unintentional dictation errors due to the inherent limitations of voice recognition software.  Final Clinical Impression(s) / ED Diagnoses Final diagnoses:  Shortness of breath  Sinus arrhythmia seen on electrocardiogram    Rx / DC Orders ED Discharge Orders         Ordered    Ambulatory referral to Cardiology        03/23/20 1248           Discharge Instructions     Your labwork and imaging were very reassuring today. I have placed  an ambulatory referral to the cardiologist - they will call to schedule an appointment for you given you are having some skipped beats on  the monitor.   Please also follow up with your PCP. If you do not have one you can [facility] for primary care needs.   Return to the ED IMMEDIATELY for any worsening symptoms including worsening shortness of breath, passing out, severe chest pain, or any other new/concerning symptoms.        Tanda RockersVenter, Tamiki Kuba, PA-C 03/23/20 1249    Terald Sleeperrifan, Matthew J, MD 03/23/20 1723

## 2020-04-15 NOTE — Progress Notes (Signed)
Cardiology Office Note:   Date:  04/18/2020  NAME:  Chad Wood    MRN: [id number] DOB:  [birth year]   PCP:  Patient, No Pcp Per  Cardiologist:  No primary care provider on file.   Referring MD: Tanda Rockers, PA-C   Chief Complaint  Patient presents with  . Palpitations    History of Present Illness:   Chad Wood is a 34 y.o. male without medical history who is being seen today for the evaluation of chest pain at the request of Tanda Rockers, PA-C. Evaluated in the ER 9/11 for chest pain and SOB. EKG normal. Troponin negative x 2. CT PE study negative. No coronary calcium on my review of CT. he reports the last 6 weeks he had intermittent episodes of shortness of breath. They mainly occur while sitting. Do not occur with exertion. Reports he feels like he cannot get air in. He reports he can walk for sports he likes. He works in [employer]. No issues completing a full day's work.  Symptoms mainly occur with working.  He reports he does smoke cigars.  Smokes 1 or few days.  He also smokes marijuana every other day.  He is also noticed he has had some heart racing episodes.  Mainly occurs after smoking marijuana.  Reports he feels like he is having extra heartbeats.  Apparently when he was in the emergency room he was noted to have extra heartbeats and was told to get checked out.  EKG demonstrates normal sinus rhythm with early repolarization abnormality.  He reports he does not have any chest pain.  Overall symptoms of shortness of breath are improving.  He has cut back on smoking.  There is no strong family history of heart disease.  He apparently did have coronavirus in August of this year.  Symptoms were shortness of breath and fever.  Also had body aches.  He is not have a primary care physician.  No recent thyroid studies.  He is euvolemic on exam.  There is no significant murmurs.  Overall he is concerned about his heart.  He wants to make sure things okay.  He reports  he had intestinal surgery as a child.  No other chronic medical problems other than tobacco abuse and marijuana use.  Past Medical History: History reviewed. No pertinent past medical history.  Past Surgical History: Past Surgical History:  Procedure Laterality Date  . SMALL INTESTINE SURGERY      Current Medications: Current Meds  Medication Sig  . Multiple Vitamins-Minerals (MULTIVITAMIN WITH MINERALS) tablet Take 1 tablet by mouth daily.   . [DISCONTINUED] penicillin v potassium (VEETID) 500 MG tablet Take 1 tablet (500 mg total) by mouth 4 (four) times daily.  . [DISCONTINUED] promethazine (PHENERGAN) 12.5 MG tablet Take 1 tablet (12.5 mg total) by mouth every 6 (six) hours as needed for nausea or vomiting.  . [DISCONTINUED] ranitidine (ZANTAC) 150 MG capsule Take 1 capsule (150 mg total) by mouth 2 (two) times daily.  . [DISCONTINUED] traMADol (ULTRAM) 50 MG tablet Take 1 tablet (50 mg total) by mouth every 6 (six) hours as needed.     Allergies:    Patient has no known allergies.   Social History: Social History   Socioeconomic History  . Marital status: Single    Spouse name: Not on file  . Number of children: 1  . Years of education: Not on file  . Highest education level: Not on file  Occupational History  . Occupation:  restaurant  Tobacco Use  . Smoking status: Current Every Day Smoker    Years: 10.00    Types: Cigars  . Smokeless tobacco: Never Used  Substance and Sexual Activity  . Alcohol use: Yes    Comment: occas  . Drug use: Yes    Types: Marijuana  . Sexual activity: Not on file  Other Topics Concern  . Not on file  Social History Narrative  . Not on file   Social Determinants of Health   Financial Resource Strain:   . Difficulty of Paying Living Expenses: Not on file  Food Insecurity:   . Worried About Programme researcher, broadcasting/film/videounning Out of Food in the Last Year: Not on file  . Ran Out of Food in the Last Year: Not on file  Transportation Needs:   . Lack of  Transportation (Medical): Not on file  . Lack of Transportation (Non-Medical): Not on file  Physical Activity:   . Days of Exercise per Week: Not on file  . Minutes of Exercise per Session: Not on file  Stress:   . Feeling of Stress : Not on file  Social Connections:   . Frequency of Communication with Friends and Family: Not on file  . Frequency of Social Gatherings with Friends and Family: Not on file  . Attends Religious Services: Not on file  . Active Member of Clubs or Organizations: Not on file  . Attends BankerClub or Organization Meetings: Not on file  . Marital Status: Not on file     Family History: The patient's family history includes Heart attack in his paternal grandfather and paternal grandmother.  ROS:   All other ROS reviewed and negative. Pertinent positives noted in the HPI.     EKGs/Labs/Other Studies Reviewed:   The following studies were personally reviewed by me today:  EKG:  EKG is ordered today.  The ekg ordered today demonstrates normal sinus rhythm, heart rate 64, early repolarization abnormality noted, normal EKG, and was personally reviewed by me.   Recent Labs: 03/23/2020: ALT 33; BUN 6; Creatinine, Ser 0.97; Hemoglobin 13.5; Platelets 315; Potassium 3.1; Sodium 136   Recent Lipid Panel No results found for: CHOL, TRIG, HDL, CHOLHDL, VLDL, LDLCALC, LDLDIRECT  Physical Exam:   VS:  BP 125/60   Pulse 64   Temp (!) 96.8 F (36 C)   Ht 5\' 3"  (1.6 m)   Wt 134 lb 12.8 oz (61.1 kg)   SpO2 99%   BMI 23.88 kg/m    Wt Readings from Last 3 Encounters:  04/18/20 134 lb 12.8 oz (61.1 kg)  03/23/20 160 lb (72.6 kg)  05/04/14 135 lb 6.4 oz (61.4 kg)    General: Well nourished, well developed, in no acute distress Heart: Atraumatic, normal size  Eyes: PEERLA, EOMI  Neck: Supple, no JVD Endocrine: No thryomegaly Cardiac: Normal S1, S2; RRR; no murmurs, rubs, or gallops Lungs: Clear to auscultation bilaterally, no wheezing, rhonchi or rales  Abd: Soft,  nontender, no hepatomegaly  Ext: No edema, pulses 2+ Musculoskeletal: No deformities, BUE and BLE strength normal and equal Skin: Warm and dry, no rashes   Neuro: Alert and oriented to person, place, time, and situation, CNII-XII grossly intact, no focal deficits  Psych: Normal mood and affect   ASSESSMENT:   Clearance CootsGeorge W Malkiewicz is a 34 y.o. male who presents for the following: 1. Palpitations   2. SOB (shortness of breath) on exertion     PLAN:   1. Palpitations 2. SOB (shortness of breath) on exertion -  Shortness of breath while sitting.  Not with exertion.  EKG with normal sinus rhythm and early repolarization abnormality.  No murmurs on exam.  I highly doubt he has heart failure.  He does have extra heartbeats when I listen to him.  I suspect he may be having PACs or PVCs.  This could be contributing to his symptoms.  I recommended a TSH.  I would also like to complete his work-up with a BNP.  This will exclude heart failure.  Recent CT PE study negative.  No significant lung disease.  He has no symptoms concerning for angina.  I would also like to proceed with a 7-day Zio patch.  This will exclude any arrhythmia.  Symptoms are worsened by smoking.  Have asked him to quit smoking.  I will see him back in 3 months to reassess symptoms.  He may need an echo if we find any abnormalities.  Disposition: Return in about 3 months (around 07/19/2020).  Medication Adjustments/Labs and Tests Ordered: Current medicines are reviewed at length with the patient today.  Concerns regarding medicines are outlined above.  Orders Placed This Encounter  Procedures  . TSH  . Brain natriuretic peptide  . LONG TERM MONITOR (3-14 DAYS)  . EKG 12-Lead   No orders of the defined types were placed in this encounter.   Patient Instructions  Medication Instructions:  The current medical regimen is effective;  continue present plan and medications.  *If you need a refill on your cardiac medications before your  next appointment, please call your pharmacy*   Lab Work: TSH, BNP  If you have labs (blood work) drawn today and your tests are completely normal, you will receive your results only by: Marland Kitchen MyChart Message (if you have MyChart) OR . A paper copy in the mail If you have any lab test that is abnormal or we need to change your treatment, we will call you to review the results.   Testing/Procedures: Your physician has recommended that you wear a 7 DAY ZIO-PATCH monitor. The Zio patch cardiac monitor continuously records heart rhythm data, this is for patients being evaluated for multiple types heart rhythms. For the first 24 hours post application, please avoid getting the Zio monitor wet in the shower or by excessive sweating during exercise. After that, feel free to carry on with regular activities. Keep soaps and lotions away from the ZIO XT Patch.  Someone will be calling you to let you know when this is mailed.  This will be mailed to you, please expect 7-10 days to receive.      Call [facility] Customer Care at [phone] if you have questions regarding your ZIO XT patch monitor.  Call them immediately if you see an orange light blinking on your monitor.   If your monitor falls off in less than 4 days contact our Monitor department at [phone].  If your monitor becomes loose or falls off after 4 days call [employer] at [phone] for suggestions on securing your monitor     Follow-Up: At [facility], you and your health needs are our priority.  As part of our continuing mission to provide you with exceptional heart care, we have created designated Provider Care Teams.  These Care Teams include your primary Cardiologist (physician) and Advanced Practice Providers (APPs -  Physician Assistants and Nurse Practitioners) who all work together to provide you with the care you need, when you need it.  We recommend signing up for the patient portal called "  MyChart".  Sign up  information is provided on this After Visit Summary.  MyChart is used to connect with patients for Virtual Visits (Telemedicine).  Patients are able to view lab/test results, encounter notes, upcoming appointments, etc.  Non-urgent messages can be sent to your provider as well.   To learn more about what you can do with MyChart, go to [email or web address].    Your next appointment:   3 month(s)  The format for your next appointment:   In Person  Provider:   Lennie Odor, MD       Signed, Lenna Gilford. Flora Lipps, MD [facility]  [address], [address] [place], [place] [address] [phone]  04/18/2020 11:41 AM

## 2020-04-18 ENCOUNTER — Telehealth: Payer: Self-pay

## 2020-04-18 ENCOUNTER — Telehealth: Payer: Self-pay | Admitting: Radiology

## 2020-04-18 ENCOUNTER — Ambulatory Visit (INDEPENDENT_AMBULATORY_CARE_PROVIDER_SITE_OTHER): Payer: Self-pay | Admitting: Cardiovascular Disease

## 2020-04-18 ENCOUNTER — Other Ambulatory Visit: Payer: Self-pay

## 2020-04-18 ENCOUNTER — Encounter: Payer: Self-pay | Admitting: Cardiovascular Disease

## 2020-04-18 VITALS — BP 125/60 | HR 64 | Temp 96.8°F | Ht 63.0 in | Wt 134.8 lb

## 2020-04-18 DIAGNOSIS — R0602 Shortness of breath: Secondary | ICD-10-CM

## 2020-04-18 DIAGNOSIS — R002 Palpitations: Secondary | ICD-10-CM

## 2020-04-18 NOTE — Telephone Encounter (Signed)
Talked with patient about sending a referral for him to get assistance with insurance

## 2020-04-18 NOTE — Patient Instructions (Signed)
Medication Instructions:  The current medical regimen is effective;  continue present plan and medications.  *If you need a refill on your cardiac medications before your next appointment, please call your pharmacy*   Lab Work: TSH, BNP  If you have labs (blood work) drawn today and your tests are completely normal, you will receive your results only by: Marland Kitchen MyChart Message (if you have MyChart) OR . A paper copy in the mail If you have any lab test that is abnormal or we need to change your treatment, we will call you to review the results.   Testing/Procedures: Your physician has recommended that you wear a 7 DAY ZIO-PATCH monitor. The Zio patch cardiac monitor continuously records heart rhythm data, this is for patients being evaluated for multiple types heart rhythms. For the first 24 hours post application, please avoid getting the Zio monitor wet in the shower or by excessive sweating during exercise. After that, feel free to carry on with regular activities. Keep soaps and lotions away from the ZIO XT Patch.  Someone will be calling you to let you know when this is mailed.  This will be mailed to you, please expect 7-10 days to receive.      Call [facility] Customer Care at [phone] if you have questions regarding your ZIO XT patch monitor.  Call them immediately if you see an orange light blinking on your monitor.   If your monitor falls off in less than 4 days contact our Monitor department at [phone].  If your monitor becomes loose or falls off after 4 days call [employer] at [phone] for suggestions on securing your monitor     Follow-Up: At [facility], you and your health needs are our priority.  As part of our continuing mission to provide you with exceptional heart care, we have created designated Provider Care Teams.  These Care Teams include your primary Cardiologist (physician) and Advanced Practice Providers (APPs -  Physician Assistants and  Nurse Practitioners) who all work together to provide you with the care you need, when you need it.  We recommend signing up for the patient portal called "MyChart".  Sign up information is provided on this After Visit Summary.  MyChart is used to connect with patients for Virtual Visits (Telemedicine).  Patients are able to view lab/test results, encounter notes, upcoming appointments, etc.  Non-urgent messages can be sent to your provider as well.   To learn more about what you can do with MyChart, go to [email or web address].    Your next appointment:   3 month(s)  The format for your next appointment:   In Person  Provider:   Lennie Odor, MD

## 2020-04-18 NOTE — Telephone Encounter (Signed)
Enrolled patient for a 7 day Zio XT monitor to be mailed to patients home. Patient is aware to call Z[employer] about self pay options.

## 2020-04-19 ENCOUNTER — Telehealth: Payer: Self-pay | Admitting: Licensed Clinical Social Worker

## 2020-04-19 NOTE — Telephone Encounter (Signed)
CSW received referral to assist patient with insurance. CSW contacted patient who reports he works part time and is engaged to be married with no children. CSW provided information on [employer] and encouraged patient to get a PCP appointment at [facility]. Patient will be able to speak with a financial counselor at [facility] once established with care. CSW mailed application for [employer] and also included Good Rx card for any needed medications. Patient verbalizes understanding and will follow up and return call to CSW as needed. Lasandra Beech, LCSW, CCSW-MCS [phone]

## 2020-04-20 LAB — BRAIN NATRIURETIC PEPTIDE: BNP: 15.3 pg/mL (ref 0.0–100.0)

## 2020-04-20 LAB — TSH: TSH: 1.34 u[IU]/mL (ref 0.450–4.500)

## 2020-04-23 ENCOUNTER — Other Ambulatory Visit (INDEPENDENT_AMBULATORY_CARE_PROVIDER_SITE_OTHER): Payer: Self-pay

## 2020-04-23 DIAGNOSIS — R002 Palpitations: Secondary | ICD-10-CM

## 2020-05-09 ENCOUNTER — Encounter: Payer: Self-pay | Admitting: *Deleted

## 2020-05-20 ENCOUNTER — Other Ambulatory Visit: Payer: Self-pay | Admitting: *Deleted

## 2020-05-20 DIAGNOSIS — I491 Atrial premature depolarization: Secondary | ICD-10-CM

## 2020-05-20 DIAGNOSIS — R002 Palpitations: Secondary | ICD-10-CM

## 2020-05-20 MED ORDER — METOPROLOL SUCCINATE ER 25 MG PO TB24: 25.0000 mg | ORAL_TABLET | Freq: Every day | ORAL | 3 refills | Status: AC

## 2020-06-10 ENCOUNTER — Telehealth (HOSPITAL_COMMUNITY): Payer: Self-pay | Admitting: Cardiovascular Disease

## 2020-06-10 NOTE — Telephone Encounter (Signed)
Just FYI. Thanks!  

## 2020-06-10 NOTE — Telephone Encounter (Signed)
Patient cancelled echocardiogram due to unable to afford at this time. Patient did not wish to reschedule at this time. Order will be removed from the Echo WQ.

## 2020-06-13 ENCOUNTER — Other Ambulatory Visit (HOSPITAL_COMMUNITY): Payer: Self-pay

## 2020-07-21 NOTE — Progress Notes (Deleted)
Cardiology Office Note:   Date:  07/21/2020  NAME:  Chad Wood    MRN: [id number] DOB:  1985/10/18   PCP:  Patient, No Pcp Per  Cardiologist:  No primary care provider on file.  Electrophysiologist:  None   Referring MD: No ref. provider found   No chief complaint on file. ***  History of Present Illness:   Chad Wood is a 35 y.o. male with a hx of PACs who presents for follow-up. Seen in October for SOB at rest. Echo not completed. BNP normal. Zio with PACs. Started on metoprolol.   Problem List 1. PACs -3.2% burden   Past Medical History: No past medical history on file.  Past Surgical History: Past Surgical History:  Procedure Laterality Date  . SMALL INTESTINE SURGERY      Current Medications: No outpatient medications have been marked as taking for the 07/22/20 encounter (Appointment) with O'Neal, Ronnald Ramp, MD.     Allergies:    Patient has no known allergies.   Social History: Social History   Socioeconomic History  . Marital status: Single    Spouse name: Not on file  . Number of children: 1  . Years of education: Not on file  . Highest education level: Not on file  Occupational History  . Occupation: restaurant  Tobacco Use  . Smoking status: Current Every Day Smoker    Years: 10.00    Types: Cigars  . Smokeless tobacco: Never Used  Substance and Sexual Activity  . Alcohol use: Yes    Comment: occas  . Drug use: Yes    Types: Marijuana  . Sexual activity: Not on file  Other Topics Concern  . Not on file  Social History Narrative  . Not on file   Social Determinants of Health   Financial Resource Strain: Not on file  Food Insecurity: Not on file  Transportation Needs: Not on file  Physical Activity: Not on file  Stress: Not on file  Social Connections: Not on file     Family History: The patient's ***family history includes Heart attack in his paternal grandfather and paternal grandmother.  ROS:   All other ROS  reviewed and negative. Pertinent positives noted in the HPI.     EKGs/Labs/Other Studies Reviewed:   The following studies were personally reviewed by me today:  EKG:  EKG is *** ordered today.  The ekg ordered today demonstrates ***, and was personally reviewed by me.   Zio 05/13/2020 Enrollment 04/23/2020-04/30/2020 (6 days 16 hours). Patient had a min HR of 32 bpm (sinus bradycardia), max HR of 153 bpm (sinus tachycardia), and avg HR of 83 bpm (normal sinus rhythm). Predominant underlying rhythm was Sinus Rhythm. Second Degree AV Block-Mobitz I (Wenckebach) was present. Isolated SVEs were occasional (3.1%, [id number]), SVE Couplets were rare (<1.0%, 133), and SVE Triplets were rare (<1.0%, 29). Isolated VEs were rare (<1.0%), and no VE Couplets or VE Triplets were present.  There was 1 patient triggered event associated with sinus rhythm 95 bpm.   Impression: 1. Occasional PACs (burden 3.1%).  2. No arrhythmias.    Recent Labs: 03/23/2020: ALT 33; BUN 6; Creatinine, Ser 0.97; Hemoglobin 13.5; Platelets 315; Potassium 3.1; Sodium 136 04/18/2020: BNP 15.3; TSH 1.340   Recent Lipid Panel No results found for: CHOL, TRIG, HDL, CHOLHDL, VLDL, LDLCALC, LDLDIRECT  Physical Exam:   VS:  There were no vitals taken for this visit.   Wt Readings from Last 3 Encounters:  04/18/20 134 lb 12.8  oz (61.1 kg)  03/23/20 160 lb (72.6 kg)  05/04/14 135 lb 6.4 oz (61.4 kg)    General: Well nourished, well developed, in no acute distress Head: Atraumatic, normal size  Eyes: PEERLA, EOMI  Neck: Supple, no JVD Endocrine: No thryomegaly Cardiac: Normal S1, S2; RRR; no murmurs, rubs, or gallops Lungs: Clear to auscultation bilaterally, no wheezing, rhonchi or rales  Abd: Soft, nontender, no hepatomegaly  Ext: No edema, pulses 2+ Musculoskeletal: No deformities, BUE and BLE strength normal and equal Skin: Warm and dry, no rashes   Neuro: Alert and oriented to person, place, time, and situation, CNII-XII  grossly intact, no focal deficits  Psych: Normal mood and affect   ASSESSMENT:   Chad Wood is a 35 y.o. male who presents for the following: No diagnosis found.  PLAN:   There are no diagnoses linked to this encounter.  Disposition: No follow-ups on file.  Medication Adjustments/Labs and Tests Ordered: Current medicines are reviewed at length with the patient today.  Concerns regarding medicines are outlined above.  No orders of the defined types were placed in this encounter.  No orders of the defined types were placed in this encounter.   There are no Patient Instructions on file for this visit.   Time Spent with Patient: I have spent a total of *** minutes with patient reviewing hospital notes, telemetry, EKGs, labs and examining the patient as well as establishing an assessment and plan that was discussed with the patient.  > 50% of time was spent in direct patient care.  Signed, Lenna Gilford. Flora Lipps, MD [facility]  [address], [address] [place], [place] [address] [phone]  07/21/2020 9:22 AM

## 2020-07-22 ENCOUNTER — Ambulatory Visit: Payer: Self-pay | Admitting: Cardiovascular Disease

## 2020-07-22 DIAGNOSIS — I491 Atrial premature depolarization: Secondary | ICD-10-CM

## 2020-07-22 DIAGNOSIS — R0602 Shortness of breath: Secondary | ICD-10-CM

## 2020-07-22 DIAGNOSIS — R002 Palpitations: Secondary | ICD-10-CM

## 2020-07-22 DIAGNOSIS — Z720 Tobacco use: Secondary | ICD-10-CM

## 2022-02-25 IMAGING — DX DG CHEST 1V PORT
1 series · 1 of 1 positions shown · non-contrast
Comparison: None.

CLINICAL DATA: SOB X2 days.SOB

EXAM:
PORTABLE CHEST 1 VIEW

[chest ap]
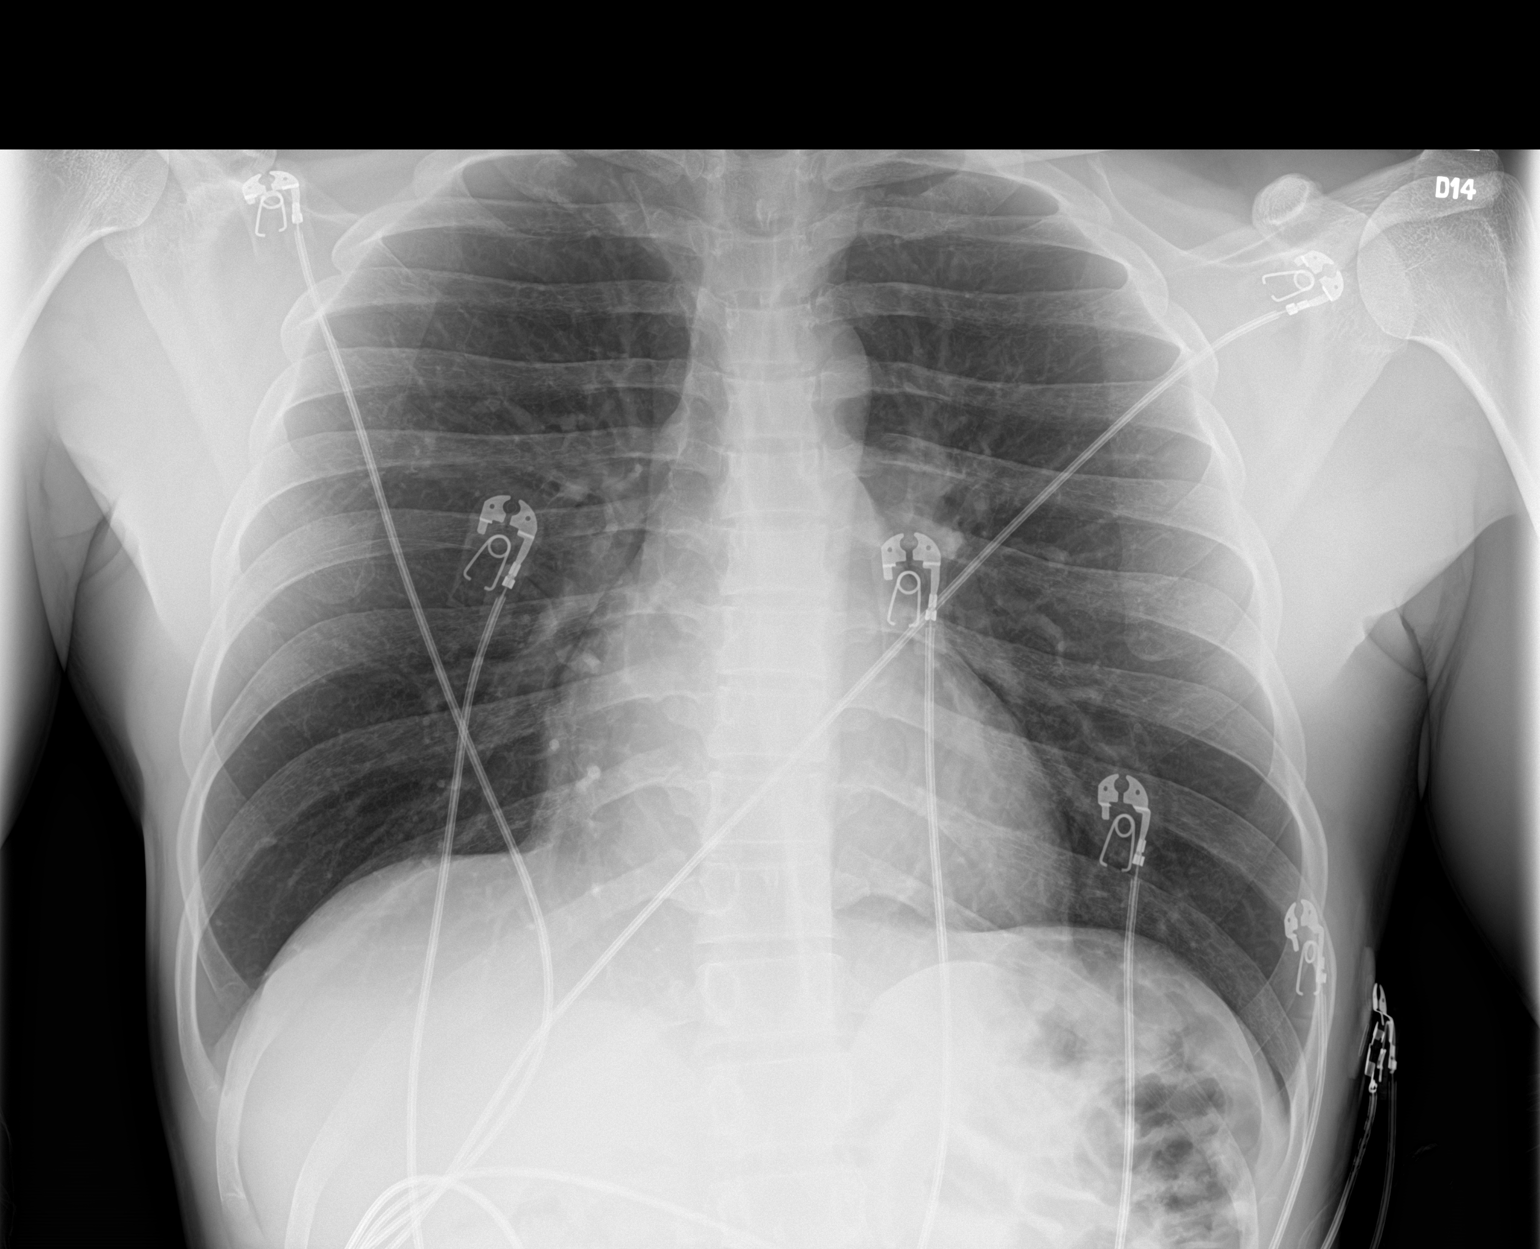

[1 of 1 positions shown; findings below may reference images not displayed]

FINDINGS: Normal mediastinum and cardiac silhouette. Normal pulmonary
vasculature. No evidence of effusion, infiltrate, or pneumothorax.
No acute bony abnormality.
IMPRESSION: No acute cardiopulmonary process.
